# Patient Record
Sex: Female | Born: 1970 | Race: Black or African American | Hispanic: No | Smoking: Never smoker
Health system: Southern US, Community
[De-identification: ages and names within clinical notes are randomized; demographics above are authoritative.]

## PROBLEM LIST (undated history)

## (undated) DIAGNOSIS — I10 Essential (primary) hypertension: Secondary | ICD-10-CM

## (undated) DIAGNOSIS — A539 Syphilis, unspecified: Secondary | ICD-10-CM

## (undated) DIAGNOSIS — F419 Anxiety disorder, unspecified: Secondary | ICD-10-CM

## (undated) DIAGNOSIS — A599 Trichomoniasis, unspecified: Secondary | ICD-10-CM

## (undated) DIAGNOSIS — E0590 Thyrotoxicosis, unspecified without thyrotoxic crisis or storm: Secondary | ICD-10-CM

## (undated) DIAGNOSIS — A749 Chlamydial infection, unspecified: Secondary | ICD-10-CM

## (undated) DIAGNOSIS — F1010 Alcohol abuse, uncomplicated: Secondary | ICD-10-CM

## (undated) HISTORY — PX: OTHER SURGICAL HISTORY: SHX169

## (undated) HISTORY — PX: TUBAL LIGATION: SHX77

## (undated) HISTORY — PX: NOVASURE ABLATION: SHX5394

## (undated) HISTORY — PX: INDUCED ABORTION: SHX677

---

## 1998-07-06 ENCOUNTER — Emergency Department (HOSPITAL_COMMUNITY): Admission: EM | Admit: 1998-07-06 | Discharge: 1998-07-06 | Payer: Self-pay | Admitting: Emergency Medicine

## 1999-12-04 ENCOUNTER — Emergency Department (HOSPITAL_COMMUNITY): Admission: EM | Admit: 1999-12-04 | Discharge: 1999-12-04 | Payer: Self-pay | Admitting: Emergency Medicine

## 2001-07-11 ENCOUNTER — Inpatient Hospital Stay (HOSPITAL_COMMUNITY): Admission: AD | Admit: 2001-07-11 | Discharge: 2001-07-11 | Payer: Self-pay | Admitting: Obstetrics & Gynecology

## 2001-10-06 ENCOUNTER — Encounter: Payer: Self-pay | Admitting: Obstetrics

## 2001-10-06 ENCOUNTER — Inpatient Hospital Stay (HOSPITAL_COMMUNITY): Admission: AD | Admit: 2001-10-06 | Discharge: 2001-10-06 | Payer: Self-pay | Admitting: Obstetrics

## 2001-11-12 ENCOUNTER — Inpatient Hospital Stay (HOSPITAL_COMMUNITY): Admission: AD | Admit: 2001-11-12 | Discharge: 2001-11-12 | Payer: Self-pay | Admitting: Obstetrics

## 2002-02-22 ENCOUNTER — Inpatient Hospital Stay (HOSPITAL_COMMUNITY): Admission: AD | Admit: 2002-02-22 | Discharge: 2002-02-24 | Payer: Self-pay | Admitting: Obstetrics

## 2002-02-22 ENCOUNTER — Encounter (INDEPENDENT_AMBULATORY_CARE_PROVIDER_SITE_OTHER): Payer: Self-pay | Admitting: Specialist

## 2006-12-23 ENCOUNTER — Ambulatory Visit (HOSPITAL_COMMUNITY): Admission: RE | Admit: 2006-12-23 | Discharge: 2006-12-23 | Payer: Self-pay | Admitting: Obstetrics

## 2009-05-18 ENCOUNTER — Encounter (INDEPENDENT_AMBULATORY_CARE_PROVIDER_SITE_OTHER): Payer: Self-pay | Admitting: Obstetrics

## 2009-05-18 ENCOUNTER — Ambulatory Visit (HOSPITAL_COMMUNITY): Admission: RE | Admit: 2009-05-18 | Discharge: 2009-05-18 | Payer: Self-pay | Admitting: Obstetrics

## 2010-05-03 ENCOUNTER — Emergency Department (HOSPITAL_COMMUNITY): Admission: EM | Admit: 2010-05-03 | Discharge: 2010-05-03 | Payer: Self-pay | Admitting: Emergency Medicine

## 2010-07-17 ENCOUNTER — Inpatient Hospital Stay (HOSPITAL_COMMUNITY): Admission: EM | Admit: 2010-07-17 | Discharge: 2010-07-20 | Payer: Self-pay | Admitting: Emergency Medicine

## 2010-07-17 ENCOUNTER — Encounter (INDEPENDENT_AMBULATORY_CARE_PROVIDER_SITE_OTHER): Payer: Self-pay | Admitting: Internal Medicine

## 2011-03-02 LAB — BASIC METABOLIC PANEL
CO2: 24 mEq/L (ref 19–32)
Calcium: 9.6 mg/dL (ref 8.4–10.5)
Sodium: 139 mEq/L (ref 135–145)

## 2011-03-02 LAB — COMPREHENSIVE METABOLIC PANEL
Albumin: 3 g/dL — ABNORMAL LOW (ref 3.5–5.2)
Alkaline Phosphatase: 48 U/L (ref 39–117)
BUN: 3 mg/dL — ABNORMAL LOW (ref 6–23)
Chloride: 109 mEq/L (ref 96–112)
Glucose, Bld: 101 mg/dL — ABNORMAL HIGH (ref 70–99)
Potassium: 3.2 mEq/L — ABNORMAL LOW (ref 3.5–5.1)
Total Bilirubin: 1.5 mg/dL — ABNORMAL HIGH (ref 0.3–1.2)

## 2011-03-02 LAB — DIFFERENTIAL
Basophils Absolute: 0 10*3/uL (ref 0.0–0.1)
Basophils Relative: 0 % (ref 0–1)
Lymphocytes Relative: 48 % — ABNORMAL HIGH (ref 12–46)
Lymphs Abs: 1.8 10*3/uL (ref 0.7–4.0)
Monocytes Absolute: 0.5 10*3/uL (ref 0.1–1.0)
Monocytes Relative: 13 % — ABNORMAL HIGH (ref 3–12)
Neutro Abs: 1.1 10*3/uL — ABNORMAL LOW (ref 1.7–7.7)
Neutro Abs: 1.8 10*3/uL (ref 1.7–7.7)
Neutrophils Relative %: 31 % — ABNORMAL LOW (ref 43–77)
Neutrophils Relative %: 44 % (ref 43–77)

## 2011-03-02 LAB — CBC
HCT: 30.9 % — ABNORMAL LOW (ref 36.0–46.0)
Hemoglobin: 10.4 g/dL — ABNORMAL LOW (ref 12.0–15.0)
Hemoglobin: 10.4 g/dL — ABNORMAL LOW (ref 12.0–15.0)
MCH: 27.6 pg (ref 26.0–34.0)
MCV: 82.2 fL (ref 78.0–100.0)
RBC: 3.76 MIL/uL — ABNORMAL LOW (ref 3.87–5.11)
RBC: 3.77 MIL/uL — ABNORMAL LOW (ref 3.87–5.11)
WBC: 3.7 10*3/uL — ABNORMAL LOW (ref 4.0–10.5)
WBC: 4.2 10*3/uL (ref 4.0–10.5)

## 2011-03-02 LAB — CARDIAC PANEL(CRET KIN+CKTOT+MB+TROPI)
CK, MB: 1.2 ng/mL (ref 0.3–4.0)
Relative Index: INVALID (ref 0.0–2.5)
Relative Index: INVALID (ref 0.0–2.5)

## 2011-03-02 LAB — T3: T3, Total: 571.8 ng/dl — ABNORMAL HIGH (ref 80.0–204.0)

## 2011-03-02 LAB — T4, FREE: Free T4: 4.83 ng/dL — ABNORMAL HIGH (ref 0.80–1.80)

## 2011-03-03 LAB — URINE MICROSCOPIC-ADD ON

## 2011-03-03 LAB — URINALYSIS, ROUTINE W REFLEX MICROSCOPIC
Bilirubin Urine: NEGATIVE
Nitrite: NEGATIVE
Specific Gravity, Urine: 1.028 (ref 1.005–1.030)
pH: 5.5 (ref 5.0–8.0)

## 2011-03-03 LAB — WET PREP, GENITAL: Yeast Wet Prep HPF POC: NONE SEEN

## 2011-03-03 LAB — POCT I-STAT, CHEM 8
BUN: 9 mg/dL (ref 6–23)
Calcium, Ion: 1.23 mmol/L (ref 1.12–1.32)
Chloride: 109 mEq/L (ref 96–112)
Glucose, Bld: 113 mg/dL — ABNORMAL HIGH (ref 70–99)
TCO2: 22 mmol/L (ref 0–100)

## 2011-03-03 LAB — CBC
HCT: 32.7 % — ABNORMAL LOW (ref 36.0–46.0)
Hemoglobin: 11.1 g/dL — ABNORMAL LOW (ref 12.0–15.0)
MCV: 85.3 fL (ref 78.0–100.0)
RBC: 3.83 MIL/uL — ABNORMAL LOW (ref 3.87–5.11)
WBC: 6.7 10*3/uL (ref 4.0–10.5)

## 2011-03-03 LAB — DIFFERENTIAL
Eosinophils Relative: 2 % (ref 0–5)
Lymphocytes Relative: 16 % (ref 12–46)
Lymphs Abs: 1.1 10*3/uL (ref 0.7–4.0)
Monocytes Absolute: 0.6 10*3/uL (ref 0.1–1.0)
Neutro Abs: 4.8 10*3/uL (ref 1.7–7.7)

## 2011-03-03 LAB — GC/CHLAMYDIA PROBE AMP, GENITAL
Chlamydia, DNA Probe: NEGATIVE
GC Probe Amp, Genital: NEGATIVE

## 2011-03-03 LAB — POCT PREGNANCY, URINE: Preg Test, Ur: NEGATIVE

## 2011-03-26 LAB — CBC
Hemoglobin: 13.6 g/dL (ref 12.0–15.0)
MCHC: 33.6 g/dL (ref 30.0–36.0)
RBC: 4.63 MIL/uL (ref 3.87–5.11)
WBC: 4.9 10*3/uL (ref 4.0–10.5)

## 2011-03-26 LAB — PREGNANCY, URINE: Preg Test, Ur: NEGATIVE

## 2011-03-28 ENCOUNTER — Other Ambulatory Visit (HOSPITAL_COMMUNITY): Payer: Self-pay | Admitting: Obstetrics

## 2011-03-28 DIAGNOSIS — Z1231 Encounter for screening mammogram for malignant neoplasm of breast: Secondary | ICD-10-CM

## 2011-04-10 ENCOUNTER — Ambulatory Visit (HOSPITAL_COMMUNITY)
Admission: RE | Admit: 2011-04-10 | Discharge: 2011-04-10 | Disposition: A | Discharge status: Home / Self Care | Payer: Medicaid Other | Source: Ambulatory Visit | Attending: Obstetrics | Admitting: Obstetrics

## 2011-04-10 DIAGNOSIS — Z1231 Encounter for screening mammogram for malignant neoplasm of breast: Secondary | ICD-10-CM | POA: Insufficient documentation

## 2011-04-12 ENCOUNTER — Ambulatory Visit (HOSPITAL_COMMUNITY): Payer: Medicaid Other

## 2011-05-01 NOTE — Op Note (Signed)
NAME:  Kendra Zimmerman, Kendra Zimmerman               ACCOUNT NO.:  [id number]   MEDICAL RECORD NO.:  [id number]          PATIENT TYPE:  AMB   LOCATION:  SDC                           FACILITY:  [facility]   PHYSICIAN:  Kathreen Cosier, M.D.DATE OF BIRTH:  01/08/1971   DATE OF PROCEDURE:  DATE OF DISCHARGE:                               OPERATIVE REPORT   PREOPERATIVE DIAGNOSIS:  Dysfunctional uterine bleeding.   PROCEDURE:  Under spinal anesthesia, the patient in lithotomy position,  abdomen, perineum, and vagina prepped and draped.  Bimanual exam  revealed the uterus to be normal sized, negative adnexa.  Speculum  placed in the vagina.  Cervix injected with 10 mL of 1% Xylocaine.  Anterior lip of the cervix grasped with tenaculum.  Endocervix curetted.  Small amount of tissue obtained.  Cavity sounded 10 cm.  Cervix measured  5 cm.  Cavity length 5 cm.  Cervix dilated to #25 Shawnie Pons and then the  hysteroscope inserted.  The cavity normal.  The hysteroscope removed and  sharp curettage performed.  Large amount of tissue obtained.  A NovaSure  device was then inserted in the cavity.  Integrity test passed.  Cavity  width was 4 cm.  The NovaSure ablation was done at 110 watts for 1  minute and 10 seconds.  The fluid deficit was 75 mL.  A repeat  hysteroscopy revealed total ablation of the endometrial cavity.  The  patient tolerated the procedure well and taken to recovery room in good  condition.           ______________________________  Kathreen Cosier, M.D.     BAM/MEDQ  D:  05/18/2009  T:  05/19/2009  Job:  [id number]

## 2011-05-04 NOTE — Op Note (Signed)
[facility]  Patient:    Kendra Zimmerman, Kendra Zimmerman Visit Number: [id number] MRN: [id number]          Service Type: OBS Location: 910A 9110 01 Attending Physician:  Venita Sheffield Dictated by:   Kathreen Cosier, M.D. Proc. Date: 02/23/02 Admit Date:  02/22/2002                             Operative Report  PREOPERATIVE DIAGNOSES:       Multiparity.  PROCEDURE:                    Postpartum tubal ligation.  Using epidural with patient in the supine position abdomen prepped and draped, bladder emptied with straight catheter.  Midline subumbilical incision 1 inch long was made, carried down to the fascia.  Fascia grasped with two Kochers.  Fascia and peritoneum opened with Mayo scissors.  Left tube grasped in the mid portion with Babcock clamp and tube traced to the fimbria.  Suture 0 plain placed in the mesosalpinx below the portion of tube within the clamp.  This was tied and approximately 1 inch of tube transected.  Procedure done in a similar fashion on other side.  Abdomen closed in layers.  Peritoneum and fascia continuous suture of 0 Dexon.  Skin closed with subcuticular stitch of 3-0 plain. Patient tolerated the procedure well.  Taken to recovery room in good condition. Dictated by:   Kathreen Cosier, M.D. Attending Physician:  Venita Sheffield DD:  02/23/02 TD:  02/24/02 Job: [id number]

## 2011-05-04 NOTE — Discharge Summary (Signed)
[facility]  Patient:    Kendra Zimmerman, Kendra Zimmerman Visit Number: [id number] MRN: [id number]          Service Type: OBS Location: 910A 9110 01 Attending Physician:  Venita Sheffield Dictated by:   Kathreen Cosier, M.D. Admit Date:  02/22/2002 Discharge Date: 02/24/2002                             Discharge Summary  HOSPITAL COURSE:              The patient is a 40 year old gravida 52, para 5-0-4-5, [facility] February 24, 2002 admitted in labor and had a normal vaginal delivery on February 22, 2002.  The patient desired sterilization and on February 23, 2002 underwent postpartum tubal ligation.  Her hemoglobin was 10.2.  She was discharged home on the second postpartum day, ambulatory, on a regular diet and Tylox for pain and ferrous sulfate.  DISCHARGE DIAGNOSIS:          Status post normal vaginal delivery and                               postpartum tubal. Dictated by:   Kathreen Cosier, M.D. Attending Physician:  Venita Sheffield DD:  02/24/02 TD:  02/25/02 Job: [id number]

## 2011-05-25 ENCOUNTER — Other Ambulatory Visit (HOSPITAL_COMMUNITY): Payer: Self-pay | Admitting: Obstetrics

## 2011-05-25 DIAGNOSIS — R1030 Lower abdominal pain, unspecified: Secondary | ICD-10-CM

## 2011-05-30 ENCOUNTER — Other Ambulatory Visit (HOSPITAL_COMMUNITY): Payer: Self-pay | Admitting: Obstetrics

## 2011-05-30 ENCOUNTER — Ambulatory Visit (HOSPITAL_COMMUNITY): Payer: Medicaid Other

## 2011-05-30 ENCOUNTER — Ambulatory Visit (HOSPITAL_COMMUNITY)
Admission: RE | Admit: 2011-05-30 | Discharge: 2011-05-30 | Disposition: A | Discharge status: Home / Self Care | Payer: Medicaid Other | Source: Ambulatory Visit | Attending: Obstetrics | Admitting: Obstetrics

## 2011-05-30 ENCOUNTER — Other Ambulatory Visit (HOSPITAL_COMMUNITY): Payer: Medicaid Other

## 2011-05-30 DIAGNOSIS — D250 Submucous leiomyoma of uterus: Secondary | ICD-10-CM | POA: Insufficient documentation

## 2011-05-30 DIAGNOSIS — R1030 Lower abdominal pain, unspecified: Secondary | ICD-10-CM

## 2011-05-30 DIAGNOSIS — D252 Subserosal leiomyoma of uterus: Secondary | ICD-10-CM | POA: Insufficient documentation

## 2011-05-30 DIAGNOSIS — R1013 Epigastric pain: Secondary | ICD-10-CM | POA: Insufficient documentation

## 2011-05-30 DIAGNOSIS — D251 Intramural leiomyoma of uterus: Secondary | ICD-10-CM | POA: Insufficient documentation

## 2011-05-30 DIAGNOSIS — N949 Unspecified condition associated with female genital organs and menstrual cycle: Secondary | ICD-10-CM | POA: Insufficient documentation

## 2011-05-30 DIAGNOSIS — K8020 Calculus of gallbladder without cholecystitis without obstruction: Secondary | ICD-10-CM | POA: Insufficient documentation

## 2011-08-03 ENCOUNTER — Inpatient Hospital Stay (HOSPITAL_COMMUNITY)
Admission: AD | Admit: 2011-08-03 | Discharge: 2011-08-03 | Disposition: A | Discharge status: Home / Self Care | Payer: Medicaid Other | Source: Ambulatory Visit | Attending: Obstetrics | Admitting: Obstetrics

## 2011-08-03 ENCOUNTER — Encounter (HOSPITAL_COMMUNITY): Payer: Self-pay | Admitting: *Deleted

## 2011-08-03 DIAGNOSIS — N949 Unspecified condition associated with female genital organs and menstrual cycle: Secondary | ICD-10-CM | POA: Insufficient documentation

## 2011-08-03 DIAGNOSIS — N760 Acute vaginitis: Secondary | ICD-10-CM | POA: Insufficient documentation

## 2011-08-03 DIAGNOSIS — A499 Bacterial infection, unspecified: Secondary | ICD-10-CM | POA: Insufficient documentation

## 2011-08-03 DIAGNOSIS — B9689 Other specified bacterial agents as the cause of diseases classified elsewhere: Secondary | ICD-10-CM | POA: Insufficient documentation

## 2011-08-03 HISTORY — DX: Anxiety disorder, unspecified: F41.9

## 2011-08-03 HISTORY — DX: Alcohol abuse, uncomplicated: F10.10

## 2011-08-03 HISTORY — DX: Chlamydial infection, unspecified: A74.9

## 2011-08-03 HISTORY — DX: Thyrotoxicosis, unspecified without thyrotoxic crisis or storm: E05.90

## 2011-08-03 HISTORY — DX: Trichomoniasis, unspecified: A59.9

## 2011-08-03 HISTORY — DX: Syphilis, unspecified: A53.9

## 2011-08-03 LAB — WET PREP, GENITAL

## 2011-08-03 LAB — URINALYSIS, ROUTINE W REFLEX MICROSCOPIC
Glucose, UA: NEGATIVE mg/dL
Ketones, ur: NEGATIVE mg/dL
Leukocytes, UA: NEGATIVE
pH: 6 (ref 5.0–8.0)

## 2011-08-03 MED ORDER — METRONIDAZOLE 500 MG PO TABS: 500.0000 mg | ORAL_TABLET | Freq: Two times a day (BID) | ORAL | Status: AC

## 2011-08-03 NOTE — ED Provider Notes (Addendum)
History     No chief complaint on file.  HPI Pt is not pregnant and comes in complaining of vaginal discharge since 8/13 with an odor.  She used OTC yeast suppx 3d on 7/20- pt has hx of trichomonas- no itching or irritation but bad odor.   She has a history of ablation for heavy periods and fibroids.She has RCM every 3 to 4 weeks- light but worsec ramping  She is also has hyperthyroidism being treated  .  She was tired and weak over the past 2 days with nausea.  She denies diarrhea or constipation or UTI symptoms.  She is concerned about STDs also.  Past Medical History  Diagnosis Date  . Hyperthyroidism   . Chlamydia   . Syphilis   . Trichomonas   . Alcohol abuse     sober 20 months  . Anxiety     Past Surgical History  Procedure Date  . Novasure ablation     2010  . Tubal ligation     No family history on file.  History  Substance Use Topics  . Smoking status: Never Smoker   . Smokeless tobacco: Not on file  . Alcohol Use: No    Allergies: No Known Allergies  Prescriptions prior to admission  Medication Sig Dispense Refill  . methimazole (TAPAZOLE) 10 MG tablet Take 10 mg by mouth daily.        . Multiple Vitamins-Calcium (ONE-A-DAY WOMENS FORMULA PO) Take 1 tablet by mouth daily.          ROS Physical Exam   Blood pressure 145/102, pulse 75, temperature 97.8 F (36.6 C), temperature source Oral, resp. rate 20, height 5\' 2"  (1.575 m), weight 165 lb (74.844 kg), last menstrual period 07/22/2011, SpO2 99.00%.  Physical Exam  Nursing note and vitals reviewed. Constitutional: She is oriented to person, place, and time. She appears well-developed and well-nourished.  HENT:  Head: Normocephalic.  Eyes: Pupils are equal, round, and reactive to light.  Neck: Normal range of motion. Neck supple.  Respiratory: Effort normal.  GI: Soft.  Genitourinary: Vagina normal and uterus normal. No vaginal discharge found.       Small amount of white discharge in vault; no  odor detected; cervix parous nontender; adnexal without palpable enlargement or tenderness  Musculoskeletal: Normal range of motion.  Neurological: She is alert and oriented to person, place, and time.  Skin: Skin is warm and dry.  Psychiatric: She has a normal mood and affect.    MAU Course  Procedures Pelvic exam performed with wet prep and GC/chlamydia performed MDM   Assessment and Plan   Bacterial vaginosis Will treat with Flagyl for 7 days Duran Ohern 08/03/2011, 9:36 PM

## 2011-08-03 NOTE — Progress Notes (Signed)
Pt c/o lower abdominal cramping and vaginal discharge x5 days. N/V x2 days.

## 2011-08-04 LAB — GC/CHLAMYDIA PROBE AMP, GENITAL
Chlamydia, DNA Probe: NEGATIVE
GC Probe Amp, Genital: NEGATIVE

## 2011-08-18 ENCOUNTER — Emergency Department (HOSPITAL_COMMUNITY)
Admission: EM | Admit: 2011-08-18 | Discharge: 2011-08-18 | Disposition: A | Discharge status: Home / Self Care | Payer: Medicaid Other | Attending: Emergency Medicine | Admitting: Emergency Medicine

## 2011-08-18 DIAGNOSIS — E0590 Thyrotoxicosis, unspecified without thyrotoxic crisis or storm: Secondary | ICD-10-CM | POA: Insufficient documentation

## 2011-08-18 DIAGNOSIS — H11429 Conjunctival edema, unspecified eye: Secondary | ICD-10-CM | POA: Insufficient documentation

## 2011-08-18 DIAGNOSIS — H5789 Other specified disorders of eye and adnexa: Secondary | ICD-10-CM | POA: Insufficient documentation

## 2011-08-18 DIAGNOSIS — H04209 Unspecified epiphora, unspecified lacrimal gland: Secondary | ICD-10-CM | POA: Insufficient documentation

## 2011-09-15 ENCOUNTER — Emergency Department (HOSPITAL_COMMUNITY)
Admission: EM | Admit: 2011-09-15 | Discharge: 2011-09-15 | Disposition: A | Discharge status: Home / Self Care | Payer: Medicaid Other | Attending: Emergency Medicine | Admitting: Emergency Medicine

## 2011-09-15 DIAGNOSIS — R109 Unspecified abdominal pain: Secondary | ICD-10-CM | POA: Insufficient documentation

## 2011-09-15 LAB — DIFFERENTIAL
Basophils Absolute: 0 10*3/uL (ref 0.0–0.1)
Basophils Relative: 0 % (ref 0–1)
Lymphocytes Relative: 14 % (ref 12–46)
Monocytes Absolute: 0.3 10*3/uL (ref 0.1–1.0)
Neutro Abs: 6.1 10*3/uL (ref 1.7–7.7)
Neutrophils Relative %: 80 % — ABNORMAL HIGH (ref 43–77)

## 2011-09-15 LAB — COMPREHENSIVE METABOLIC PANEL
ALT: 6 U/L (ref 0–35)
Alkaline Phosphatase: 73 U/L (ref 39–117)
BUN: 8 mg/dL (ref 6–23)
CO2: 25 mEq/L (ref 19–32)
Chloride: 102 mEq/L (ref 96–112)
Glucose, Bld: 107 mg/dL — ABNORMAL HIGH (ref 70–99)
Potassium: 3.7 mEq/L (ref 3.5–5.1)
Sodium: 134 mEq/L — ABNORMAL LOW (ref 135–145)
Total Bilirubin: 1.4 mg/dL — ABNORMAL HIGH (ref 0.3–1.2)
Total Protein: 7.7 g/dL (ref 6.0–8.3)

## 2011-09-15 LAB — CBC
HCT: 38.3 % (ref 36.0–46.0)
Hemoglobin: 13.8 g/dL (ref 12.0–15.0)
RBC: 4.58 MIL/uL (ref 3.87–5.11)
WBC: 7.6 10*3/uL (ref 4.0–10.5)

## 2011-09-15 LAB — URINALYSIS, ROUTINE W REFLEX MICROSCOPIC
Bilirubin Urine: NEGATIVE
Ketones, ur: NEGATIVE mg/dL
Nitrite: NEGATIVE
Protein, ur: NEGATIVE mg/dL
pH: 6 (ref 5.0–8.0)

## 2011-09-16 LAB — URINE CULTURE: Culture  Setup Time: 201209291750

## 2011-12-16 ENCOUNTER — Encounter (HOSPITAL_COMMUNITY): Payer: Self-pay | Admitting: *Deleted

## 2011-12-16 ENCOUNTER — Inpatient Hospital Stay (HOSPITAL_COMMUNITY)
Admission: AD | Admit: 2011-12-16 | Discharge: 2011-12-16 | Disposition: A | Discharge status: Home / Self Care | Payer: Medicaid Other | Source: Ambulatory Visit | Attending: Obstetrics | Admitting: Obstetrics

## 2011-12-16 DIAGNOSIS — N760 Acute vaginitis: Secondary | ICD-10-CM | POA: Insufficient documentation

## 2011-12-16 DIAGNOSIS — B9689 Other specified bacterial agents as the cause of diseases classified elsewhere: Secondary | ICD-10-CM | POA: Insufficient documentation

## 2011-12-16 DIAGNOSIS — A499 Bacterial infection, unspecified: Secondary | ICD-10-CM | POA: Insufficient documentation

## 2011-12-16 DIAGNOSIS — B373 Candidiasis of vulva and vagina: Secondary | ICD-10-CM

## 2011-12-16 DIAGNOSIS — B3731 Acute candidiasis of vulva and vagina: Secondary | ICD-10-CM | POA: Insufficient documentation

## 2011-12-16 DIAGNOSIS — L293 Anogenital pruritus, unspecified: Secondary | ICD-10-CM | POA: Insufficient documentation

## 2011-12-16 HISTORY — DX: Essential (primary) hypertension: I10

## 2011-12-16 LAB — URINALYSIS, ROUTINE W REFLEX MICROSCOPIC
Ketones, ur: NEGATIVE mg/dL
Leukocytes, UA: NEGATIVE
Nitrite: NEGATIVE
Protein, ur: NEGATIVE mg/dL
pH: 5.5 (ref 5.0–8.0)

## 2011-12-16 LAB — WET PREP, GENITAL

## 2011-12-16 LAB — POCT PREGNANCY, URINE: Preg Test, Ur: NEGATIVE

## 2011-12-16 MED ORDER — FLUCONAZOLE 150 MG PO TABS
150.0000 mg | ORAL_TABLET | Freq: Once | ORAL | Status: AC
  Administered 2011-12-16: 150 mg via ORAL
  Filled 2011-12-16: qty 1

## 2011-12-16 MED ORDER — METRONIDAZOLE 500 MG PO TABS: 500.0000 mg | ORAL_TABLET | Freq: Two times a day (BID) | ORAL | Status: AC

## 2011-12-16 NOTE — ED Provider Notes (Signed)
History     Chief Complaint  Patient presents with  . Vaginal Discharge  . Vaginal Itching   HPI  Pt is here with report of vaginal and rectal itching x 1 month.  No reports of lesions. +yellow discharge in underwear.  No sexual intercourse since October 2012.    Past Medical History  Diagnosis Date  . Hyperthyroidism   . Chlamydia   . Syphilis   . Trichomonas   . Alcohol abuse     sober 20 months  . Anxiety   . Hypertension     Past Surgical History  Procedure Date  . Novasure ablation     2010  . Tubal ligation   . Induced abortion     Family History  Problem Relation Age of Onset  . Anesthesia problems Neg Hx   . Hypotension Neg Hx   . Malignant hyperthermia Neg Hx   . Pseudochol deficiency Neg Hx     History  Substance Use Topics  . Smoking status: Never Smoker   . Smokeless tobacco: Not on file  . Alcohol Use: No     hx alcohol use but has stopped    Allergies: No Known Allergies  Prescriptions prior to admission  Medication Sig Dispense Refill  . methimazole (TAPAZOLE) 10 MG tablet Take 15 mg by mouth daily.       Marland Kitchen olmesartan (BENICAR) 20 MG tablet Take 20 mg by mouth daily.        Marland Kitchen DISCONTD: Multiple Vitamins-Calcium (ONE-A-DAY WOMENS FORMULA PO) Take 1 tablet by mouth daily.         Review of Systems  Gastrointestinal: Positive for abdominal pain (mild cramping).  Genitourinary:       +vaginal discharge  All other systems reviewed and are negative.   Physical Exam   Blood pressure 124/79, pulse 80, temperature 98.1 F (36.7 C), temperature source Oral, resp. rate 22, height 5\' 3"  (1.6 m), weight 77.111 kg (170 lb), last menstrual period 11/29/2011, SpO2 98.00%.  Physical Exam  Constitutional: She is oriented to person, place, and time. She appears well-developed and well-nourished. No distress.  HENT:  Head: Normocephalic.  Neck: Normal range of motion. Neck supple.  Cardiovascular: Normal rate and regular rhythm.   Respiratory:  Effort normal and breath sounds normal.  GI: Soft. She exhibits no mass. There is no tenderness. There is no guarding.  Genitourinary: Cervix exhibits no motion tenderness. No bleeding around the vagina. Vaginal discharge (white, creamy) found.  Neurological: She is alert and oriented to person, place, and time. She has normal reflexes.  Skin: Skin is warm and dry.    MAU Course  Procedures  Results for orders placed during the hospital encounter of 12/16/11 (from the past 24 hour(s))  URINALYSIS, ROUTINE W REFLEX MICROSCOPIC     Status: Normal   Collection Time   12/16/11  4:00 AM      Component Value Range   Color, Urine YELLOW  YELLOW    APPearance CLEAR  CLEAR    Specific Gravity, Urine 1.025  1.005 - 1.030    pH 5.5  5.0 - 8.0    Glucose, UA NEGATIVE  NEGATIVE (mg/dL)   Hgb urine dipstick NEGATIVE  NEGATIVE    Bilirubin Urine NEGATIVE  NEGATIVE    Ketones, ur NEGATIVE  NEGATIVE (mg/dL)   Protein, ur NEGATIVE  NEGATIVE (mg/dL)   Urobilinogen, UA 0.2  0.0 - 1.0 (mg/dL)   Nitrite NEGATIVE  NEGATIVE    Leukocytes, UA NEGATIVE  NEGATIVE   POCT PREGNANCY, URINE     Status: Normal   Collection Time   12/16/11  4:17 AM      Component Value Range   Preg Test, Ur NEGATIVE    WET PREP, GENITAL     Status: Abnormal   Collection Time   12/16/11  4:46 AM      Component Value Range   Yeast, Wet Prep RARE (*) NONE SEEN    Trich, Wet Prep NONE SEEN  NONE SEEN    Clue Cells, Wet Prep RARE (*) NONE SEEN    WBC, Wet Prep HPF POC MODERATE (*) NONE SEEN    Gc/ct pending  Assessment and Plan  Yeast Infection Bacterial Vaginosis  RX Diflucan Flagyl 500 mg BID x 7 days F/U w/Dr. Gaynell Face  [facility] 12/16/2011, 4:38 AM

## 2011-12-16 NOTE — Progress Notes (Signed)
Threasa Heads CNM in to see pt. Spec exam done and wet prep and GC/Chlam obtained. Pt tol well.

## 2011-12-16 NOTE — Progress Notes (Signed)
W. Muhammed CNM in earlier to discuss lab results and d/c plan.Written and verbal d/c instructions given and understanding voiced. 

## 2011-12-16 NOTE — Progress Notes (Signed)
Noticed some vag. And rectal irritation since early December. Has gradually gotten worse. Stopped BCP mid-Dec due to acne. Had uterine lining burned 05/19/2011.

## 2011-12-17 LAB — GC/CHLAMYDIA PROBE AMP, GENITAL: Chlamydia, DNA Probe: NEGATIVE

## 2013-06-22 ENCOUNTER — Other Ambulatory Visit: Payer: Self-pay | Admitting: Internal Medicine

## 2013-06-22 DIAGNOSIS — E0590 Thyrotoxicosis, unspecified without thyrotoxic crisis or storm: Secondary | ICD-10-CM

## 2013-06-29 DIAGNOSIS — E0590 Thyrotoxicosis, unspecified without thyrotoxic crisis or storm: Secondary | ICD-10-CM | POA: Insufficient documentation

## 2013-06-30 ENCOUNTER — Encounter (HOSPITAL_COMMUNITY)
Admission: RE | Admit: 2013-06-30 | Discharge: 2013-06-30 | Disposition: A | Discharge status: Home / Self Care | Payer: Medicaid Other | Source: Ambulatory Visit | Attending: Internal Medicine | Admitting: Internal Medicine

## 2013-06-30 DIAGNOSIS — E0590 Thyrotoxicosis, unspecified without thyrotoxic crisis or storm: Secondary | ICD-10-CM

## 2013-06-30 MED ORDER — SODIUM PERTECHNETATE TC 99M INJECTION
10.5000 | Freq: Once | INTRAVENOUS | Status: AC | PRN
  Administered 2013-06-30: 11 via INTRAVENOUS

## 2013-06-30 MED ORDER — SODIUM IODIDE I 131 CAPSULE
10.0000 | Freq: Once | INTRAVENOUS | Status: AC | PRN
  Administered 2013-06-29: 10 via ORAL

## 2013-07-02 ENCOUNTER — Other Ambulatory Visit (HOSPITAL_COMMUNITY): Payer: Self-pay | Admitting: Obstetrics

## 2013-07-02 DIAGNOSIS — Z1231 Encounter for screening mammogram for malignant neoplasm of breast: Secondary | ICD-10-CM

## 2013-07-06 ENCOUNTER — Other Ambulatory Visit: Payer: Self-pay | Admitting: Internal Medicine

## 2013-07-06 DIAGNOSIS — E0590 Thyrotoxicosis, unspecified without thyrotoxic crisis or storm: Secondary | ICD-10-CM

## 2013-07-06 DIAGNOSIS — E0500 Thyrotoxicosis with diffuse goiter without thyrotoxic crisis or storm: Secondary | ICD-10-CM

## 2013-07-09 ENCOUNTER — Ambulatory Visit
Admission: RE | Admit: 2013-07-09 | Discharge: 2013-07-09 | Disposition: A | Discharge status: Home / Self Care | Payer: Medicaid Other | Source: Ambulatory Visit | Attending: Internal Medicine | Admitting: Internal Medicine

## 2013-07-09 DIAGNOSIS — E0500 Thyrotoxicosis with diffuse goiter without thyrotoxic crisis or storm: Secondary | ICD-10-CM

## 2013-07-09 DIAGNOSIS — E0590 Thyrotoxicosis, unspecified without thyrotoxic crisis or storm: Secondary | ICD-10-CM

## 2013-07-14 ENCOUNTER — Ambulatory Visit (HOSPITAL_COMMUNITY)
Admission: RE | Admit: 2013-07-14 | Discharge: 2013-07-14 | Disposition: A | Discharge status: Home / Self Care | Payer: Medicaid Other | Source: Ambulatory Visit | Attending: Obstetrics | Admitting: Obstetrics

## 2013-07-14 DIAGNOSIS — Z1231 Encounter for screening mammogram for malignant neoplasm of breast: Secondary | ICD-10-CM | POA: Insufficient documentation

## 2013-07-16 ENCOUNTER — Encounter (HOSPITAL_COMMUNITY)
Admission: RE | Admit: 2013-07-16 | Discharge: 2013-07-16 | Disposition: A | Discharge status: Home / Self Care | Payer: Medicaid Other | Source: Ambulatory Visit | Attending: Internal Medicine | Admitting: Internal Medicine

## 2013-07-16 DIAGNOSIS — E0590 Thyrotoxicosis, unspecified without thyrotoxic crisis or storm: Secondary | ICD-10-CM | POA: Insufficient documentation

## 2013-07-16 MED ORDER — SODIUM IODIDE I 131 CAPSULE
17.5000 | Freq: Once | INTRAVENOUS | Status: AC | PRN
  Administered 2013-07-16: 17.5 via ORAL

## 2013-09-07 ENCOUNTER — Other Ambulatory Visit: Payer: Self-pay | Admitting: Internal Medicine

## 2013-09-07 DIAGNOSIS — E041 Nontoxic single thyroid nodule: Secondary | ICD-10-CM

## 2013-09-09 ENCOUNTER — Ambulatory Visit
Admission: RE | Admit: 2013-09-09 | Discharge: 2013-09-09 | Disposition: A | Discharge status: Home / Self Care | Payer: Medicaid Other | Source: Ambulatory Visit | Attending: Internal Medicine | Admitting: Internal Medicine

## 2013-09-09 DIAGNOSIS — E041 Nontoxic single thyroid nodule: Secondary | ICD-10-CM

## 2013-09-16 ENCOUNTER — Ambulatory Visit
Admission: RE | Admit: 2013-09-16 | Discharge: 2013-09-16 | Disposition: A | Discharge status: Home / Self Care | Payer: Medicaid Other | Source: Ambulatory Visit | Attending: Internal Medicine | Admitting: Internal Medicine

## 2013-09-16 ENCOUNTER — Other Ambulatory Visit: Payer: Self-pay | Admitting: Internal Medicine

## 2013-09-16 DIAGNOSIS — E041 Nontoxic single thyroid nodule: Secondary | ICD-10-CM

## 2013-12-22 ENCOUNTER — Other Ambulatory Visit: Payer: Self-pay | Admitting: Internal Medicine

## 2013-12-22 DIAGNOSIS — E041 Nontoxic single thyroid nodule: Secondary | ICD-10-CM

## 2013-12-30 ENCOUNTER — Other Ambulatory Visit: Payer: Medicaid Other

## 2014-01-08 ENCOUNTER — Other Ambulatory Visit: Payer: Medicaid Other

## 2014-06-22 ENCOUNTER — Other Ambulatory Visit (HOSPITAL_COMMUNITY): Payer: Self-pay | Admitting: Obstetrics

## 2014-06-22 DIAGNOSIS — Z1231 Encounter for screening mammogram for malignant neoplasm of breast: Secondary | ICD-10-CM

## 2014-07-16 ENCOUNTER — Ambulatory Visit (HOSPITAL_COMMUNITY)
Admission: RE | Admit: 2014-07-16 | Discharge: 2014-07-16 | Disposition: A | Discharge status: Home / Self Care | Payer: Medicaid Other | Source: Ambulatory Visit | Attending: Obstetrics | Admitting: Obstetrics

## 2014-07-16 DIAGNOSIS — Z1231 Encounter for screening mammogram for malignant neoplasm of breast: Secondary | ICD-10-CM | POA: Diagnosis present

## 2014-11-30 ENCOUNTER — Other Ambulatory Visit: Payer: Self-pay | Admitting: Internal Medicine

## 2014-11-30 DIAGNOSIS — E0500 Thyrotoxicosis with diffuse goiter without thyrotoxic crisis or storm: Secondary | ICD-10-CM

## 2014-12-06 ENCOUNTER — Ambulatory Visit
Admission: RE | Admit: 2014-12-06 | Discharge: 2014-12-06 | Disposition: A | Discharge status: Home / Self Care | Payer: Medicaid Other | Source: Ambulatory Visit | Attending: Internal Medicine | Admitting: Internal Medicine

## 2014-12-06 DIAGNOSIS — E0500 Thyrotoxicosis with diffuse goiter without thyrotoxic crisis or storm: Secondary | ICD-10-CM

## 2015-02-23 ENCOUNTER — Other Ambulatory Visit (HOSPITAL_COMMUNITY): Payer: Self-pay | Admitting: Obstetrics

## 2015-02-23 DIAGNOSIS — N858 Other specified noninflammatory disorders of uterus: Secondary | ICD-10-CM

## 2015-03-01 ENCOUNTER — Ambulatory Visit (HOSPITAL_COMMUNITY): Payer: Medicaid Other

## 2015-03-04 ENCOUNTER — Ambulatory Visit (HOSPITAL_COMMUNITY)
Admission: RE | Admit: 2015-03-04 | Discharge: 2015-03-04 | Disposition: A | Discharge status: Home / Self Care | Payer: Medicaid Other | Source: Ambulatory Visit | Attending: Obstetrics | Admitting: Obstetrics

## 2015-03-04 DIAGNOSIS — D252 Subserosal leiomyoma of uterus: Secondary | ICD-10-CM | POA: Insufficient documentation

## 2015-03-04 DIAGNOSIS — N858 Other specified noninflammatory disorders of uterus: Secondary | ICD-10-CM

## 2015-03-04 DIAGNOSIS — D259 Leiomyoma of uterus, unspecified: Secondary | ICD-10-CM | POA: Diagnosis present

## 2015-03-23 ENCOUNTER — Other Ambulatory Visit (HOSPITAL_COMMUNITY): Payer: Self-pay | Admitting: Obstetrics

## 2015-03-23 DIAGNOSIS — D259 Leiomyoma of uterus, unspecified: Secondary | ICD-10-CM

## 2015-03-29 ENCOUNTER — Other Ambulatory Visit (HOSPITAL_COMMUNITY): Payer: Medicaid Other

## 2015-04-01 ENCOUNTER — Ambulatory Visit (HOSPITAL_COMMUNITY): Payer: Medicaid Other

## 2015-04-15 ENCOUNTER — Ambulatory Visit (HOSPITAL_COMMUNITY)
Admission: RE | Admit: 2015-04-15 | Discharge: 2015-04-15 | Disposition: A | Discharge status: Home / Self Care | Payer: Medicaid Other | Source: Ambulatory Visit | Attending: Obstetrics | Admitting: Obstetrics

## 2015-04-15 DIAGNOSIS — D259 Leiomyoma of uterus, unspecified: Secondary | ICD-10-CM | POA: Diagnosis not present

## 2015-10-06 ENCOUNTER — Other Ambulatory Visit: Payer: Self-pay

## 2015-10-06 DIAGNOSIS — Z1231 Encounter for screening mammogram for malignant neoplasm of breast: Secondary | ICD-10-CM

## 2015-10-24 ENCOUNTER — Ambulatory Visit
Admission: RE | Admit: 2015-10-24 | Discharge: 2015-10-24 | Disposition: A | Discharge status: Home / Self Care | Payer: Medicaid Other | Source: Ambulatory Visit

## 2015-10-24 DIAGNOSIS — Z1231 Encounter for screening mammogram for malignant neoplasm of breast: Secondary | ICD-10-CM

## 2016-01-19 ENCOUNTER — Other Ambulatory Visit (HOSPITAL_COMMUNITY): Payer: Self-pay | Admitting: Obstetrics

## 2016-01-19 DIAGNOSIS — D259 Leiomyoma of uterus, unspecified: Secondary | ICD-10-CM

## 2016-01-19 LAB — PROCEDURE REPORT - SCANNED: Pap: NEGATIVE

## 2016-01-26 ENCOUNTER — Ambulatory Visit (HOSPITAL_COMMUNITY)
Admission: RE | Admit: 2016-01-26 | Discharge: 2016-01-26 | Disposition: A | Discharge status: Home / Self Care | Payer: Medicaid Other | Source: Ambulatory Visit | Attending: Obstetrics | Admitting: Obstetrics

## 2016-01-26 DIAGNOSIS — D259 Leiomyoma of uterus, unspecified: Secondary | ICD-10-CM | POA: Insufficient documentation

## 2016-01-31 ENCOUNTER — Other Ambulatory Visit: Payer: Self-pay | Admitting: Obstetrics

## 2016-02-02 NOTE — H&P (Signed)
NAME:  Kendra Zimmerman, Kendra Zimmerman               ACCOUNT NO.:  [id number]  MEDICAL RECORD NO.:  [id number]  LOCATION:  ULT                           FACILITY:  [facility]  PHYSICIAN:  Frederico Hamman, M.D.DATE OF BIRTH:  06-05-1971  DATE OF ADMISSION:  01/26/2016 DATE OF DISCHARGE:  01/26/2016                             HISTORY & PHYSICAL   HISTORY OF PRESENT ILLNESS:  The patient is a 45 year old, gravida 39, para 6-0-4-6 with a history of large myomas, and the patient has been complaining of pain and pressure and she is in for multiple myomectomy.  PAST SURGICAL HISTORY:  She had a tubal ligation, and she also had a NovaSure ablation.  PAST MEDICAL HISTORY:  She has thyroid disease, and she is hypertensive.  MEDICATIONS:  She takes lisinopril daily for her blood pressure, levothyroxine 0.25 everyday for her thyroid.  SOCIAL HISTORY:  She denies smoking, drinking, or drug use.  SYSTEM REVIEW:  Noncontributory.  PHYSICAL EXAMINATION:  GENERAL:  Well-developed female in no distress. HEENT:  Negative. LUNGS:  Clear to P and A. HEART:  Regular rhythm.  No murmurs, no gallops. BREASTS:  Negative. ABDOMEN:  Nontender, mass arising from the pelvis.  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 PELVIC:  Her Pap smear is negative. EXTREMITIES:  Negative.          ______________________________ Frederico Hamman, M.D.     BAM/MEDQ  D:  02/02/2016  T:  02/02/2016  Job:  [id number]

## 2016-02-07 ENCOUNTER — Encounter (HOSPITAL_COMMUNITY)
Admission: RE | Admit: 2016-02-07 | Discharge: 2016-02-07 | Disposition: A | Discharge status: Home / Self Care | Payer: Medicaid Other | Source: Ambulatory Visit | Attending: Obstetrics | Admitting: Obstetrics

## 2016-02-07 ENCOUNTER — Encounter (HOSPITAL_COMMUNITY): Payer: Self-pay

## 2016-02-07 LAB — CBC
HCT: 39.8 % (ref 36.0–46.0)
Hemoglobin: 14 g/dL (ref 12.0–15.0)
MCH: 30.6 pg (ref 26.0–34.0)
MCHC: 35.2 g/dL (ref 30.0–36.0)
MCV: 86.9 fL (ref 78.0–100.0)
Platelets: 315 10*3/uL (ref 150–400)
RBC: 4.58 MIL/uL (ref 3.87–5.11)
RDW: 13 % (ref 11.5–15.5)
WBC: 4.4 10*3/uL (ref 4.0–10.5)

## 2016-02-07 LAB — TYPE AND SCREEN
ABO/RH(D): O POS
ANTIBODY SCREEN: NEGATIVE

## 2016-02-07 LAB — BASIC METABOLIC PANEL
Anion gap: 6 (ref 5–15)
BUN: 13 mg/dL (ref 6–20)
CALCIUM: 9.2 mg/dL (ref 8.9–10.3)
CO2: 26 mmol/L (ref 22–32)
CREATININE: 0.55 mg/dL (ref 0.44–1.00)
Chloride: 106 mmol/L (ref 101–111)
Glucose, Bld: 129 mg/dL — ABNORMAL HIGH (ref 65–99)
Potassium: 3.9 mmol/L (ref 3.5–5.1)
SODIUM: 138 mmol/L (ref 135–145)

## 2016-02-07 LAB — ABO/RH: ABO/RH(D): O POS

## 2016-02-07 NOTE — Patient Instructions (Signed)
Your procedure is scheduled on: February 08, 2016    Enter through the Main Entrance of [facility] at: 7:30 am   Pick up the phone at the desk and dial [phone].  Call this number if you have problems the morning of surgery: [phone].  Remember: Do NOT eat food: after midnight tonight  Do NOT drink clear liquids after: midnight tonight  Take these medicines the morning of surgery with a SIP OF WATER: Synthroid   Do NOT wear jewelry (body piercing), metal hair clips/bobby pins, make-up, or nail polish. Do NOT wear lotions, powders, or perfumes.  You may wear deoderant. Do NOT shave for 48 hours prior to surgery. Do NOT bring valuables to the hospital. Contacts, dentures, or bridgework may not be worn into surgery. Leave suitcase in car.  After surgery it may be brought to your room.  For patients admitted to the hospital, checkout time is 11:00 AM the day of discharge.

## 2016-02-08 ENCOUNTER — Inpatient Hospital Stay (HOSPITAL_COMMUNITY): Payer: Medicaid Other | Admitting: Anesthesiology

## 2016-02-08 ENCOUNTER — Encounter (HOSPITAL_COMMUNITY)
Admission: RE | Disposition: A | Discharge status: Home / Self Care | Payer: Self-pay | Source: Ambulatory Visit | Attending: Obstetrics

## 2016-02-08 ENCOUNTER — Inpatient Hospital Stay (HOSPITAL_COMMUNITY)
Admission: RE | Admit: 2016-02-08 | Discharge: 2016-02-10 | DRG: 743 | Disposition: A | Discharge status: Home / Self Care | Payer: Medicaid Other | Source: Ambulatory Visit | Attending: Obstetrics | Admitting: Obstetrics

## 2016-02-08 ENCOUNTER — Encounter (HOSPITAL_COMMUNITY): Payer: Self-pay | Admitting: Anesthesiology

## 2016-02-08 DIAGNOSIS — E079 Disorder of thyroid, unspecified: Secondary | ICD-10-CM | POA: Diagnosis present

## 2016-02-08 DIAGNOSIS — Z79899 Other long term (current) drug therapy: Secondary | ICD-10-CM

## 2016-02-08 DIAGNOSIS — D259 Leiomyoma of uterus, unspecified: Principal | ICD-10-CM | POA: Diagnosis present

## 2016-02-08 DIAGNOSIS — I10 Essential (primary) hypertension: Secondary | ICD-10-CM | POA: Diagnosis present

## 2016-02-08 DIAGNOSIS — D219 Benign neoplasm of connective and other soft tissue, unspecified: Secondary | ICD-10-CM | POA: Diagnosis present

## 2016-02-08 HISTORY — PX: MYOMECTOMY: SHX85

## 2016-02-08 LAB — PREGNANCY, URINE: PREG TEST UR: NEGATIVE

## 2016-02-08 SURGERY — MYOMECTOMY, ABDOMINAL APPROACH
Anesthesia: General

## 2016-02-08 MED ORDER — LABETALOL HCL 5 MG/ML IV SOLN
INTRAVENOUS | Status: DC | PRN
  Administered 2016-02-08: 5 mg via INTRAVENOUS

## 2016-02-08 MED ORDER — HYDROMORPHONE HCL 1 MG/ML IJ SOLN
INTRAMUSCULAR | Status: DC | PRN
  Administered 2016-02-08: 0.5 mg via INTRAVENOUS
  Administered 2016-02-08: .5 mg via INTRAVENOUS

## 2016-02-08 MED ORDER — KETOROLAC TROMETHAMINE 30 MG/ML IJ SOLN: 30.0000 mg | Freq: Once | INTRAMUSCULAR | Status: DC

## 2016-02-08 MED ORDER — ROCURONIUM BROMIDE 100 MG/10ML IV SOLN
INTRAVENOUS | Status: DC | PRN
  Administered 2016-02-08 (×3): 10 mg via INTRAVENOUS
  Administered 2016-02-08: 30 mg via INTRAVENOUS

## 2016-02-08 MED ORDER — SCOPOLAMINE 1 MG/3DAYS TD PT72
MEDICATED_PATCH | TRANSDERMAL | Status: AC
  Administered 2016-02-08: 1.5 mg via TRANSDERMAL
  Filled 2016-02-08: qty 1

## 2016-02-08 MED ORDER — IBUPROFEN 800 MG PO TABS
800.0000 mg | ORAL_TABLET | Freq: Three times a day (TID) | ORAL | Status: DC | PRN
  Administered 2016-02-09 – 2016-02-10 (×3): 800 mg via ORAL
  Filled 2016-02-08 (×3): qty 1

## 2016-02-08 MED ORDER — DEXAMETHASONE SODIUM PHOSPHATE 10 MG/ML IJ SOLN
INTRAMUSCULAR | Status: AC
  Filled 2016-02-08: qty 1

## 2016-02-08 MED ORDER — PROMETHAZINE HCL 25 MG/ML IJ SOLN: 6.2500 mg | INTRAMUSCULAR | Status: DC | PRN

## 2016-02-08 MED ORDER — GLYCOPYRROLATE 0.2 MG/ML IJ SOLN
INTRAMUSCULAR | Status: DC | PRN
  Administered 2016-02-08: .6 mg via INTRAVENOUS

## 2016-02-08 MED ORDER — ONDANSETRON HCL 4 MG/2ML IJ SOLN
INTRAMUSCULAR | Status: AC
  Filled 2016-02-08: qty 2

## 2016-02-08 MED ORDER — MIDAZOLAM HCL 2 MG/2ML IJ SOLN
INTRAMUSCULAR | Status: DC | PRN
  Administered 2016-02-08: 2 mg via INTRAVENOUS

## 2016-02-08 MED ORDER — KETOROLAC TROMETHAMINE 30 MG/ML IJ SOLN: 30.0000 mg | Freq: Four times a day (QID) | INTRAMUSCULAR | Status: DC

## 2016-02-08 MED ORDER — PROPOFOL 10 MG/ML IV BOLUS
INTRAVENOUS | Status: DC | PRN
  Administered 2016-02-08: 200 mg via INTRAVENOUS

## 2016-02-08 MED ORDER — PROPOFOL 10 MG/ML IV BOLUS
INTRAVENOUS | Status: AC
  Filled 2016-02-08: qty 20

## 2016-02-08 MED ORDER — SCOPOLAMINE 1 MG/3DAYS TD PT72
1.0000 | MEDICATED_PATCH | Freq: Once | TRANSDERMAL | Status: DC
  Administered 2016-02-08: 1.5 mg via TRANSDERMAL

## 2016-02-08 MED ORDER — KETOROLAC TROMETHAMINE 30 MG/ML IJ SOLN
INTRAMUSCULAR | Status: DC | PRN
  Administered 2016-02-08: 30 mg via INTRAVENOUS

## 2016-02-08 MED ORDER — FENTANYL CITRATE (PF) 250 MCG/5ML IJ SOLN
INTRAMUSCULAR | Status: AC
  Filled 2016-02-08: qty 5

## 2016-02-08 MED ORDER — LIDOCAINE HCL (CARDIAC) 20 MG/ML IV SOLN
INTRAVENOUS | Status: AC
  Filled 2016-02-08: qty 5

## 2016-02-08 MED ORDER — LISINOPRIL 20 MG PO TABS
20.0000 mg | ORAL_TABLET | Freq: Every day | ORAL | Status: DC
  Filled 2016-02-08 (×3): qty 1

## 2016-02-08 MED ORDER — ONDANSETRON HCL 4 MG/2ML IJ SOLN
INTRAMUSCULAR | Status: DC | PRN
  Administered 2016-02-08: 4 mg via INTRAVENOUS

## 2016-02-08 MED ORDER — LABETALOL HCL 5 MG/ML IV SOLN
INTRAVENOUS | Status: AC
  Filled 2016-02-08: qty 4

## 2016-02-08 MED ORDER — CEFAZOLIN SODIUM-DEXTROSE 2-3 GM-% IV SOLR
2.0000 g | INTRAVENOUS | Status: AC
  Administered 2016-02-08: 2 g via INTRAVENOUS

## 2016-02-08 MED ORDER — LABETALOL HCL 5 MG/ML IV SOLN
5.0000 mg | INTRAVENOUS | Status: DC | PRN
  Administered 2016-02-08: 5 mg via INTRAVENOUS

## 2016-02-08 MED ORDER — OXYCODONE-ACETAMINOPHEN 5-325 MG PO TABS
1.0000 | ORAL_TABLET | ORAL | Status: DC | PRN
  Administered 2016-02-09 (×2): 2 via ORAL
  Administered 2016-02-10: 1 via ORAL
  Filled 2016-02-08: qty 1
  Filled 2016-02-08: qty 2

## 2016-02-08 MED ORDER — LIDOCAINE HCL (CARDIAC) 20 MG/ML IV SOLN
INTRAVENOUS | Status: DC | PRN
  Administered 2016-02-08: 100 mg via INTRAVENOUS

## 2016-02-08 MED ORDER — ROCURONIUM BROMIDE 100 MG/10ML IV SOLN
INTRAVENOUS | Status: AC
  Filled 2016-02-08: qty 1

## 2016-02-08 MED ORDER — CEFAZOLIN SODIUM-DEXTROSE 2-3 GM-% IV SOLR
INTRAVENOUS | Status: AC
  Filled 2016-02-08: qty 50

## 2016-02-08 MED ORDER — SODIUM CHLORIDE 0.9 % IR SOLN
Status: DC | PRN
  Administered 2016-02-08: 2000 mL

## 2016-02-08 MED ORDER — HYDROMORPHONE HCL 1 MG/ML IJ SOLN
INTRAMUSCULAR | Status: AC
Start: 2016-02-08 — End: 2016-02-08
  Filled 2016-02-08: qty 1

## 2016-02-08 MED ORDER — MICROFIBRILLAR COLL HEMOSTAT EX POWD
CUTANEOUS | Status: AC
  Filled 2016-02-08: qty 5

## 2016-02-08 MED ORDER — NEOSTIGMINE METHYLSULFATE 10 MG/10ML IV SOLN
INTRAVENOUS | Status: DC | PRN
  Administered 2016-02-08: 3.5 mg via INTRAVENOUS

## 2016-02-08 MED ORDER — DEXAMETHASONE SODIUM PHOSPHATE 10 MG/ML IJ SOLN
INTRAMUSCULAR | Status: DC | PRN
  Administered 2016-02-08: 8 mg via INTRAVENOUS

## 2016-02-08 MED ORDER — LEVOTHYROXINE SODIUM 25 MCG PO TABS
25.0000 ug | ORAL_TABLET | Freq: Every day | ORAL | Status: DC
  Administered 2016-02-09: 25 ug via ORAL
  Filled 2016-02-08 (×2): qty 1

## 2016-02-08 MED ORDER — LACTATED RINGERS IV SOLN
INTRAVENOUS | Status: DC
  Administered 2016-02-08 (×3): via INTRAVENOUS

## 2016-02-08 MED ORDER — NEOSTIGMINE METHYLSULFATE 10 MG/10ML IV SOLN
INTRAVENOUS | Status: AC
  Filled 2016-02-08: qty 1

## 2016-02-08 MED ORDER — MIDAZOLAM HCL 2 MG/2ML IJ SOLN
INTRAMUSCULAR | Status: AC
  Filled 2016-02-08: qty 2

## 2016-02-08 MED ORDER — HYDROMORPHONE HCL 1 MG/ML IJ SOLN
0.2000 mg | INTRAMUSCULAR | Status: DC | PRN
  Administered 2016-02-08: 1 mg via INTRAVENOUS
  Filled 2016-02-08: qty 1

## 2016-02-08 MED ORDER — KETOROLAC TROMETHAMINE 30 MG/ML IJ SOLN
30.0000 mg | Freq: Four times a day (QID) | INTRAMUSCULAR | Status: DC
  Administered 2016-02-08 – 2016-02-09 (×3): 30 mg via INTRAVENOUS
  Filled 2016-02-08 (×4): qty 1

## 2016-02-08 MED ORDER — FENTANYL CITRATE (PF) 100 MCG/2ML IJ SOLN
INTRAMUSCULAR | Status: DC | PRN
  Administered 2016-02-08 (×3): 50 ug via INTRAVENOUS
  Administered 2016-02-08: 100 ug via INTRAVENOUS
  Administered 2016-02-08 (×4): 50 ug via INTRAVENOUS

## 2016-02-08 MED ORDER — KETOROLAC TROMETHAMINE 30 MG/ML IJ SOLN
INTRAMUSCULAR | Status: AC
  Filled 2016-02-08: qty 1

## 2016-02-08 MED ORDER — HYDROMORPHONE HCL 1 MG/ML IJ SOLN
INTRAMUSCULAR | Status: AC
  Filled 2016-02-08: qty 1

## 2016-02-08 MED ORDER — HYDROMORPHONE HCL 1 MG/ML IJ SOLN
0.2500 mg | INTRAMUSCULAR | Status: DC | PRN
  Administered 2016-02-08: 0.25 mg via INTRAVENOUS

## 2016-02-08 MED ORDER — GLYCOPYRROLATE 0.2 MG/ML IJ SOLN
INTRAMUSCULAR | Status: AC
  Filled 2016-02-08: qty 3

## 2016-02-08 SURGICAL SUPPLY — 35 items
BARRIER ADHS 3X4 INTERCEED (GAUZE/BANDAGES/DRESSINGS) ×2 IMPLANT
CANISTER SUCT 3000ML (MISCELLANEOUS) ×2 IMPLANT
CHLORAPREP W/TINT 26ML (MISCELLANEOUS) ×2 IMPLANT
CLOTH BEACON ORANGE TIMEOUT ST (SAFETY) ×2 IMPLANT
CONT PATH 16OZ SNAP LID 3702 (MISCELLANEOUS) ×2 IMPLANT
DECANTER SPIKE VIAL GLASS SM (MISCELLANEOUS) ×2 IMPLANT
DRSG OPSITE POSTOP 4X10 (GAUZE/BANDAGES/DRESSINGS) ×2 IMPLANT
GAUZE SPONGE 4X4 16PLY XRAY LF (GAUZE/BANDAGES/DRESSINGS) ×4 IMPLANT
GLOVE BIO SURGEON STRL SZ8.5 (GLOVE) ×2 IMPLANT
GLOVE BIOGEL PI IND STRL 7.0 (GLOVE) ×1 IMPLANT
GLOVE BIOGEL PI INDICATOR 7.0 (GLOVE) ×1
GOWN STRL REUS W/TWL 2XL LVL3 (GOWN DISPOSABLE) ×2 IMPLANT
GOWN STRL REUS W/TWL LRG LVL3 (GOWN DISPOSABLE) ×2 IMPLANT
HEMOSTAT ARISTA ABSORB 3G PWDR (MISCELLANEOUS) ×2 IMPLANT
NEEDLE HYPO 22GX1.5 SAFETY (NEEDLE) ×2 IMPLANT
NS IRRIG 1000ML POUR BTL (IV SOLUTION) ×2 IMPLANT
PACK ABDOMINAL GYN (CUSTOM PROCEDURE TRAY) ×2 IMPLANT
PAD OB MATERNITY 4.3X12.25 (PERSONAL CARE ITEMS) ×2 IMPLANT
PENCIL SMOKE EVAC W/HOLSTER (ELECTROSURGICAL) ×2 IMPLANT
STAPLER VISISTAT 35W (STAPLE) ×2 IMPLANT
SUT CHROMIC 0 CT 1 (SUTURE) IMPLANT
SUT CHROMIC 1 (SUTURE) IMPLANT
SUT CHROMIC 1 CTX 36 (SUTURE) ×8 IMPLANT
SUT CHROMIC 1MO 4 18 CR8 (SUTURE) ×6 IMPLANT
SUT CHROMIC 2 0 SH (SUTURE) ×4 IMPLANT
SUT MON AB 4-0 PS1 27 (SUTURE) ×2 IMPLANT
SUT PLAIN 2 0 XLH (SUTURE) IMPLANT
SUT VIC AB 0 CT1 18XCR BRD8 (SUTURE) IMPLANT
SUT VIC AB 0 CT1 27 (SUTURE) ×2
SUT VIC AB 0 CT1 27XBRD ANBCTR (SUTURE) ×1 IMPLANT
SUT VIC AB 0 CT1 8-18 (SUTURE)
SYR CONTROL 10ML LL (SYRINGE) ×2 IMPLANT
TOWEL OR 17X24 6PK STRL BLUE (TOWEL DISPOSABLE) ×4 IMPLANT
TRAY FOLEY CATH SILVER 14FR (SET/KITS/TRAYS/PACK) ×2 IMPLANT
WATER STERILE IRR 1000ML POUR (IV SOLUTION) ×2 IMPLANT

## 2016-02-08 NOTE — Anesthesia Postprocedure Evaluation (Signed)
Anesthesia Post Note  Patient: Kendra Zimmerman  Procedure(s) Performed: Procedure(s) (LRB): ABDOMINAL MYOMECTOMY (N/A)  Patient location during evaluation: [facility] Unit Anesthesia Type: General Level of consciousness: sedated Pain management: pain level controlled Vital Signs Assessment: post-procedure vital signs reviewed and stable Respiratory status: spontaneous breathing Cardiovascular status: stable Postop Assessment: no signs of nausea or vomiting and adequate PO intake Anesthetic complications: no    Last Vitals:  Filed Vitals:   02/08/16 1330 02/08/16 1430  BP: 120/79 119/75  Pulse: 74 79  Temp: 36.6 C 36.8 C  Resp: 16 16    Last Pain:  Filed Vitals:   02/08/16 1501  PainSc: 1                  Katelin Kutsch

## 2016-02-08 NOTE — Transfer of Care (Signed)
Immediate Anesthesia Transfer of Care Note  Patient: Kendra Zimmerman  Procedure(s) Performed: Procedure(s): ABDOMINAL MYOMECTOMY (N/A)  Patient Location: PACU  Anesthesia Type:General  Level of Consciousness: awake, alert , oriented and patient cooperative  Airway & Oxygen Therapy: Patient Spontanous Breathing and Patient connected to nasal cannula oxygen  Post-op Assessment: Report given to RN and Post -op Vital signs reviewed and stable  Post vital signs: Reviewed and stable  Last Vitals:  Filed Vitals:   02/08/16 0742  BP: 133/93  Pulse: 78  Temp: 36.8 C  Resp: 18    Complications: No apparent anesthesia complications

## 2016-02-08 NOTE — Anesthesia Procedure Notes (Addendum)
Procedure Name: Intubation Date/Time: 02/08/2016 9:01 AM Performed by: Georgeanne Nim Pre-anesthesia Checklist: Patient identified, Patient being monitored, Timeout performed, Emergency Drugs available and Suction available Patient Re-evaluated:Patient Re-evaluated prior to inductionOxygen Delivery Method: Circle system utilized Preoxygenation: Pre-oxygenation with 100% oxygen Intubation Type: IV induction Ventilation: Mask ventilation without difficulty Laryngoscope Size: Mac and 3 Grade View: Grade I Tube type: Oral Tube size: 7.0 mm Number of attempts: 1 Airway Equipment and Method: Stylet Placement Confirmation: ETT inserted through vocal cords under direct vision,  breath sounds checked- equal and bilateral,  positive ETCO2 and CO2 detector Secured at: 21 cm Tube secured with: Tape

## 2016-02-08 NOTE — Op Note (Signed)
Preop diagnosis myoma uteri Postop diagnosis multiple myomectomy Surgeon Dr. Gracy Racer First assistant Dr. Brantley Persons Procedure  Under  general anesthesia patient in the supine position abdomen prepped and draped bladder emptied with a Foley catheter a transverse suprapubic incision made carried   to the rectus fascia fascia incised length of the incision recti muscles retracted laterally peritoneum incised longitudinally she had 20 week size myomatous uterus was exteriorized   a large posterior myoma both 10 x 10 and uterine capsule was incised using blunt and sharp dissection the myoma was removed hemostasis was established was achieved and with #1 chromic in 3 layers and then on the fundus was 5 cm myoma and this was removed in the usual fashion and 2 other myomas removed similarly hemostasis achieved and 2 layers of continuous #1 suture blood loss was 1400 cc   ovaries were normal both  Tubes  had previously been cut and Avitene was placed over the areas where myomas had been removed and a lap and sponge counts correct abdomen closed in layers peritoneum continuous double chromic fascia continuous with  0 dexon  skin closed  subcuticular stitch of 4-0 Monocryl patient tolerated procedure well taken to recovery room in good condition

## 2016-02-08 NOTE — Anesthesia Postprocedure Evaluation (Signed)
Anesthesia Post Note  Patient: Kendra Zimmerman  Procedure(s) Performed: Procedure(s) (LRB): ABDOMINAL MYOMECTOMY (N/A)  Patient location during evaluation: PACU Anesthesia Type: General Level of consciousness: awake Pain management: pain level controlled Vital Signs Assessment: post-procedure vital signs reviewed and stable Respiratory status: spontaneous breathing Cardiovascular status: stable Postop Assessment: no signs of nausea or vomiting Anesthetic complications: no    Last Vitals:  Filed Vitals:   02/08/16 1430 02/08/16 1805  BP: 119/75 118/80  Pulse: 79 78  Temp: 36.8 C 36.6 C  Resp: 16 16    Last Pain:  Filed Vitals:   02/08/16 1923  PainSc: [place]

## 2016-02-08 NOTE — Anesthesia Preprocedure Evaluation (Signed)
Anesthesia Evaluation  Patient identified by MRN, date of birth, ID band Patient awake    Reviewed: Allergy & Precautions, NPO status , Patient's Chart, lab work & pertinent test results  Airway Mallampati: II  TM Distance: >3 FB Neck ROM: Full    Dental no notable dental hx.    Pulmonary neg pulmonary ROS,    Pulmonary exam normal breath sounds clear to auscultation       Cardiovascular hypertension, Pt. on medications Normal cardiovascular exam Rhythm:Regular Rate:Normal     Neuro/Psych Anxiety negative neurological ROS     GI/Hepatic negative GI ROS, Neg liver ROS,   Endo/Other  Hyperthyroidism   Renal/GU negative Renal ROS  negative genitourinary   Musculoskeletal negative musculoskeletal ROS (+)   Abdominal   Peds negative pediatric ROS (+)  Hematology negative hematology ROS (+)   Anesthesia Other Findings   Reproductive/Obstetrics negative OB ROS                             Anesthesia Physical Anesthesia Plan  ASA: II  Anesthesia Plan: General   Post-op Pain Management:    Induction: Intravenous  Airway Management Planned: Oral ETT  Additional Equipment:   Intra-op Plan:   Post-operative Plan: Extubation in OR  Informed Consent: I have reviewed the patients History and Physical, chart, labs and discussed the procedure including the risks, benefits and alternatives for the proposed anesthesia with the patient or authorized representative who has indicated his/her understanding and acceptance.   Dental advisory given  Plan Discussed with: CRNA  Anesthesia Plan Comments:         Anesthesia Quick Evaluation

## 2016-02-08 NOTE — H&P (Signed)
  There has been no change in the history and physical since the original dictation 

## 2016-02-09 ENCOUNTER — Encounter (HOSPITAL_COMMUNITY): Payer: Self-pay | Admitting: Obstetrics

## 2016-02-09 LAB — CBC
HEMATOCRIT: 24.7 % — AB (ref 36.0–46.0)
Hemoglobin: 8.7 g/dL — ABNORMAL LOW (ref 12.0–15.0)
MCH: 30.3 pg (ref 26.0–34.0)
MCHC: 35.2 g/dL (ref 30.0–36.0)
MCV: 86.1 fL (ref 78.0–100.0)
Platelets: 250 10*3/uL (ref 150–400)
RBC: 2.87 MIL/uL — ABNORMAL LOW (ref 3.87–5.11)
RDW: 12.9 % (ref 11.5–15.5)
WBC: 9.7 10*3/uL (ref 4.0–10.5)

## 2016-02-09 NOTE — Progress Notes (Signed)
Patient ID: Kendra Zimmerman, female   DOB: [birth year], 45 y.o.   MRN: [id number] Post op day one Last blood pressure 180/70 of previous blood pressures were normal pulse 100 respirations 16 patient has no complaints Abdomen soft bowel sounds present Output good legs negative regular diet today  Dc iv and  Foley doing well

## 2016-02-10 MED ORDER — IBUPROFEN 800 MG PO TABS: 800.0000 mg | ORAL_TABLET | Freq: Three times a day (TID) | ORAL | Status: DC | PRN

## 2016-02-10 MED ORDER — OXYCODONE-ACETAMINOPHEN 5-325 MG PO TABS: 1.0000 | ORAL_TABLET | ORAL | Status: DC | PRN

## 2016-02-10 NOTE — Discharge Instructions (Signed)
Hysterectomy, Abdominal  °Care After °Please read the instructions below. Refer to these instructions for the next few weeks. These instructions provide you with general information on caring for yourself after surgery. Your caregiver may also give you specific instructions. While your treatment has been planned according to the most current medical practices available, unavoidable problems sometimes happen. If you have any problems or questions after you leave, please call your caregiver. °HOME CARE INSTRUCTIONS  °Healing will take time. You will have discomfort, tenderness, swelling and bruising at the operative site for a couple of weeks. This is normal and will get better as time goes on.  °· Only take over-the-counter or prescription medicines for pain, discomfort or fever as directed by your caregiver.  °· Do not take aspirin. It can cause bleeding.  °· Do not drive when taking pain medication.  °· Follow your caregiver's advice regarding diet, exercise, lifting, driving and general activities.  °· Resume your usual diet as directed and allowed.  °· Get plenty of rest and sleep.  °· Do not douche, use tampons, or have sexual intercourse until your caregiver gives you permission.  °· Change your bandages (dressings) as directed.  °· Take your temperature twice a day. Write it down.  °· Your caregiver may recommend showers instead of baths for a few weeks.  °· Do not drink alcohol until your caregiver gives you permission.  °· If you develop constipation, you may take a mild laxative with your caregiver's permission. Bran foods and drinking fluids helps with constipation problems.  °· Try to have someone home with you for a week or two to help with the household activities.  °· Make sure you and your family understands everything about your operation and recovery.  °· Do not sign any legal documents until you feel normal again.  °· Keep all your follow-up appointments as recommended by your caregiver.  °SEEK  MEDICAL CARE IF:  °· There is swelling, redness or increasing pain in the wound area.  °· Pus is coming from the wound.  °· You notice a bad smell from the wound or surgical dressing.  °· You have pain, redness and swelling from the intravenous site.  °· The wound is breaking open (the edges are not staying together).  °· You feel dizzy or feel like fainting.  °· You develop pain or bleeding when you urinate.  °· You develop diarrhea.  °· You develop nausea and vomiting.  °· You develop abnormal vaginal discharge.  °· You develop a rash.  °· You have any type of abnormal reaction or develop an allergy to your medication.  °· You need stronger pain medication for your pain.  °SEEK IMMEDIATE MEDICAL CARE IF: °· You have a fever.  °· You develop abdominal pain.  °· You develop chest pain.  °· You develop shortness of breath.  °· You pass out.  °· You develop pain, swelling or redness of your leg.  °· You develop heavy vaginal bleeding with or without blood clots.  °Document Released: 06/22/2005 Document Revised: 06/08/2011 Document Reviewed: 09/04/2009 °[employer]® Patient Information ©2012 [employer]. °

## 2016-02-10 NOTE — Progress Notes (Signed)
Pt ambulated out with RN .   Pt states she has left the building  And taken her boyfriend home   . Informed pt she left without  Proper d/c  Instructions given   Incisional care reviewed   Reported incident to  House coverage

## 2016-02-10 NOTE — Progress Notes (Signed)
Patient ID: Kendra Zimmerman, female   DOB: 01-18-1971, 45 y.o.   MRN: [id number] Postop day 2 Blood pressure 140/93 pulse 100 respiration 18 Patient dressed says she is going home today she is passing yes she's got good bowel sounds her incision is clean and dry legs are negative she been discharged today to see me in 2 weeks

## 2016-02-10 NOTE — Discharge Summary (Signed)
  Patient was admitted because of 20 week myomas and her desire for myomectomy she underwent multiple myomectomy and postoperatively did well she was discharged on the second postoperative day to see me in 2 weeks she is on Synthroid and lisinopril for her blood pressure

## 2016-08-06 ENCOUNTER — Emergency Department (HOSPITAL_COMMUNITY)
Admission: EM | Admit: 2016-08-06 | Discharge: 2016-08-06 | Disposition: A | Discharge status: Home / Self Care | Payer: Medicaid Other | Attending: Emergency Medicine | Admitting: Emergency Medicine

## 2016-08-06 ENCOUNTER — Encounter (HOSPITAL_COMMUNITY): Payer: Self-pay | Admitting: Nurse Practitioner

## 2016-08-06 DIAGNOSIS — Z5321 Procedure and treatment not carried out due to patient leaving prior to being seen by health care provider: Secondary | ICD-10-CM

## 2016-08-06 DIAGNOSIS — I10 Essential (primary) hypertension: Secondary | ICD-10-CM | POA: Diagnosis not present

## 2016-08-06 DIAGNOSIS — M79604 Pain in right leg: Secondary | ICD-10-CM | POA: Insufficient documentation

## 2016-08-06 DIAGNOSIS — Z79899 Other long term (current) drug therapy: Secondary | ICD-10-CM | POA: Insufficient documentation

## 2016-08-06 NOTE — ED Triage Notes (Signed)
She c/o R foot and ankle swelling and pain. Onset after she felt a bee sting her in the back of her leg while mowing yard yesterday. She did not see the bee. She applied baking soda and water paste, anti itch cream with no relief.

## 2016-08-06 NOTE — ED Triage Notes (Signed)
PT absent from room 

## 2016-08-06 NOTE — ED Provider Notes (Signed)
Patient not in treatment room or in sub-waiting. Staff not able to locate patient in main waiting room.   Etta Quill, NP 08/06/16 1606

## 2016-08-13 ENCOUNTER — Ambulatory Visit (INDEPENDENT_AMBULATORY_CARE_PROVIDER_SITE_OTHER): Payer: Medicaid Other | Admitting: Obstetrics & Gynecology

## 2016-08-13 ENCOUNTER — Encounter: Payer: Self-pay | Admitting: Obstetrics & Gynecology

## 2016-08-13 VITALS — BP 138/96 | HR 80 | Temp 98.3°F | Ht 64.0 in | Wt 176.7 lb

## 2016-08-13 DIAGNOSIS — N946 Dysmenorrhea, unspecified: Secondary | ICD-10-CM | POA: Diagnosis not present

## 2016-08-13 DIAGNOSIS — D251 Intramural leiomyoma of uterus: Secondary | ICD-10-CM

## 2016-08-13 NOTE — Progress Notes (Signed)
Patient ID: Kendra Zimmerman, female   DOB: [birth year], 45 y.o.   MRN: [id number]  Chief Complaint  Patient presents with  . Establish Care  . Follow-up    myomectomy 01/2016  . Dysmenorrhea    extremely painful per pt, takes ibuprofen-does not help    HPI Kendra Zimmerman is a 45 y.o. female.  [id number] Patient's last menstrual period was 07/30/2016 (approximate). Menses are painful, s/p myomectomy 01/2016 HPI  Past Medical History:  Diagnosis Date  . Alcohol abuse    sober 20 months  . Anxiety   . Chlamydia   . Hypertension   . Hyperthyroidism   . Syphilis   . Trichomonas     Past Surgical History:  Procedure Laterality Date  . INDUCED ABORTION    . MYOMECTOMY N/A 02/08/2016   Procedure: ABDOMINAL MYOMECTOMY;  Surgeon: Frederico Hamman, MD;  Location: [facility];  Service: Gynecology;  Laterality: N/A;  . NOVASURE ABLATION     2010  . TUBAL LIGATION      Family History  Problem Relation Age of Onset  . Alzheimer's disease Paternal Grandmother   . Hypertension Maternal Grandmother   . Alcohol abuse Maternal Grandmother   . Anesthesia problems Neg Hx   . Hypotension Neg Hx   . Malignant hyperthermia Neg Hx   . Pseudochol deficiency Neg Hx     Social History Social History  Substance Use Topics  . Smoking status: Never Smoker  . Smokeless tobacco: Never Used  . Alcohol use No     Comment: hx alcohol use but has stopped    No Known Allergies  Current Outpatient Prescriptions  Medication Sig Dispense Refill  . ibuprofen (ADVIL,MOTRIN) 800 MG tablet Take 1 tablet (800 mg total) by mouth every 8 (eight) hours as needed (mild pain). 30 tablet 0  . levothyroxine (SYNTHROID, LEVOTHROID) 25 MCG tablet Take 25 mcg by mouth daily before breakfast.    . lisinopril (PRINIVIL,ZESTRIL) 20 MG tablet Take 20 mg by mouth daily.     No current facility-administered medications for this visit.     Review of Systems Review of Systems  Constitutional: Negative.   Respiratory:  Negative.   Genitourinary: Positive for menstrual problem (flow for only 3 days not heavy) and pelvic pain (cramps during menses).    Blood pressure (!) 138/96, pulse 80, temperature 98.3 F (36.8 C), temperature source Oral, height 5\' 4"  (1.626 m), weight 176 lb 11.2 oz (80.2 kg), last menstrual period 07/30/2016.  Physical Exam Physical Exam  Constitutional: She appears well-developed. No distress.  Pulmonary/Chest: Effort normal.  Genitourinary: Vagina normal.  Genitourinary Comments: Uterus 6+ weeks size not tender  Skin: Skin is warm and dry.  Psychiatric: She has a normal mood and affect. Her behavior is normal.  Vitals reviewed.   Data Reviewed [place] and op note  Assessment    Patient Active Problem List   Diagnosis Date Noted  . Fibroids 02/08/2016   dysmenorrhea    Plan          Pelvic US, RTC to review. Consider Mirena for menstrual symptom relief ARNOLD,JAMES 08/13/2016, 2:10 PM

## 2016-08-13 NOTE — Patient Instructions (Signed)
Levonorgestrel intrauterine device (IUD) What is this medicine? LEVONORGESTREL IUD (LEE voe nor jes trel) is a contraceptive (birth control) device. The device is placed inside the uterus by a healthcare professional. It is used to prevent pregnancy and can also be used to treat heavy bleeding that occurs during your period. Depending on the device, it can be used for 3 to 5 years. This medicine may be used for other purposes; ask your health care provider or pharmacist if you have questions. What should I tell my health care provider before I take this medicine? They need to know if you have any of these conditions: -abnormal Pap smear -cancer of the breast, uterus, or cervix -diabetes -endometritis -genital or pelvic infection now or in the past -have more than one sexual partner or your partner has more than one partner -heart disease -history of an ectopic or tubal pregnancy -immune system problems -IUD in place -liver disease or tumor -problems with blood clots or take blood-thinners -use intravenous drugs -uterus of unusual shape -vaginal bleeding that has not been explained -an unusual or allergic reaction to levonorgestrel, other hormones, silicone, or polyethylene, medicines, foods, dyes, or preservatives -pregnant or trying to get pregnant -breast-feeding How should I use this medicine? This device is placed inside the uterus by a health care professional. Talk to your pediatrician regarding the use of this medicine in children. Special care may be needed. Overdosage: If you think you have taken too much of this medicine contact a poison control center or emergency room at once. NOTE: This medicine is only for you. Do not share this medicine with others. What if I miss a dose? This does not apply. What may interact with this medicine? Do not take this medicine with any of the following medications: -amprenavir -bosentan -fosamprenavir This medicine may also interact with  the following medications: -aprepitant -barbiturate medicines for inducing sleep or treating seizures -bexarotene -griseofulvin -medicines to treat seizures like carbamazepine, ethotoin, felbamate, oxcarbazepine, phenytoin, topiramate -modafinil -pioglitazone -rifabutin -rifampin -rifapentine -some medicines to treat HIV infection like atazanavir, indinavir, lopinavir, nelfinavir, tipranavir, ritonavir -St. John's wort -warfarin This list may not describe all possible interactions. Give your health care provider a list of all the medicines, herbs, non-prescription drugs, or dietary supplements you use. Also tell them if you smoke, drink alcohol, or use illegal drugs. Some items may interact with your medicine. What should I watch for while using this medicine? Visit your doctor or health care professional for regular check ups. See your doctor if you or your partner has sexual contact with others, becomes HIV positive, or gets a sexual transmitted disease. This product does not protect you against HIV infection (AIDS) or other sexually transmitted diseases. You can check the placement of the IUD yourself by reaching up to the top of your vagina with clean fingers to feel the threads. Do not pull on the threads. It is a good habit to check placement after each menstrual period. Call your doctor right away if you feel more of the IUD than just the threads or if you cannot feel the threads at all. The IUD may come out by itself. You may become pregnant if the device comes out. If you notice that the IUD has come out use a backup birth control method like condoms and call your health care provider. Using tampons will not change the position of the IUD and are okay to use during your period. What side effects may I notice from receiving this medicine?   Side effects that you should report to your doctor or health care professional as soon as possible: -allergic reactions like skin rash, itching or  hives, swelling of the face, lips, or tongue -fever, flu-like symptoms -genital sores -high blood pressure -no menstrual period for 6 weeks during use -pain, swelling, warmth in the leg -pelvic pain or tenderness -severe or sudden headache -signs of pregnancy -stomach cramping -sudden shortness of breath -trouble with balance, talking, or walking -unusual vaginal bleeding, discharge -yellowing of the eyes or skin Side effects that usually do not require medical attention (report to your doctor or health care professional if they continue or are bothersome): -acne -breast pain -change in sex drive or performance -changes in weight -cramping, dizziness, or faintness while the device is being inserted -headache -irregular menstrual bleeding within first 3 to 6 months of use -nausea This list may not describe all possible side effects. Call your doctor for medical advice about side effects. You may report side effects to FDA at 1-800-FDA-1088. Where should I keep my medicine? This does not apply. NOTE: This sheet is a summary. It may not cover all possible information. If you have questions about this medicine, talk to your doctor, pharmacist, or health care provider.    2016, Elsevier/Gold Standard. (2012-01-03 13:54:04)  

## 2016-09-06 ENCOUNTER — Ambulatory Visit: Payer: Medicaid Other | Admitting: Obstetrics and Gynecology

## 2016-09-06 ENCOUNTER — Other Ambulatory Visit: Payer: Medicaid Other

## 2016-09-11 ENCOUNTER — Encounter: Payer: Self-pay | Admitting: *Deleted

## 2016-09-15 ENCOUNTER — Encounter (HOSPITAL_COMMUNITY): Payer: Self-pay | Admitting: *Deleted

## 2016-09-15 ENCOUNTER — Inpatient Hospital Stay (HOSPITAL_COMMUNITY)
Admission: AD | Admit: 2016-09-15 | Discharge: 2016-09-15 | Disposition: A | Discharge status: Home / Self Care | Payer: Medicaid Other | Source: Ambulatory Visit | Attending: Obstetrics & Gynecology | Admitting: Obstetrics & Gynecology

## 2016-09-15 DIAGNOSIS — I10 Essential (primary) hypertension: Secondary | ICD-10-CM | POA: Diagnosis not present

## 2016-09-15 DIAGNOSIS — E0590 Thyrotoxicosis, unspecified without thyrotoxic crisis or storm: Secondary | ICD-10-CM | POA: Diagnosis not present

## 2016-09-15 DIAGNOSIS — R110 Nausea: Secondary | ICD-10-CM | POA: Diagnosis not present

## 2016-09-15 DIAGNOSIS — R112 Nausea with vomiting, unspecified: Secondary | ICD-10-CM | POA: Diagnosis not present

## 2016-09-15 DIAGNOSIS — N946 Dysmenorrhea, unspecified: Secondary | ICD-10-CM | POA: Insufficient documentation

## 2016-09-15 DIAGNOSIS — R109 Unspecified abdominal pain: Secondary | ICD-10-CM | POA: Insufficient documentation

## 2016-09-15 LAB — CBC
HEMATOCRIT: 37.6 % (ref 36.0–46.0)
Hemoglobin: 13.2 g/dL (ref 12.0–15.0)
MCH: 29.7 pg (ref 26.0–34.0)
MCHC: 35.1 g/dL (ref 30.0–36.0)
MCV: 84.5 fL (ref 78.0–100.0)
Platelets: 350 10*3/uL (ref 150–400)
RBC: 4.45 MIL/uL (ref 3.87–5.11)
RDW: 13.2 % (ref 11.5–15.5)
WBC: 7.8 10*3/uL (ref 4.0–10.5)

## 2016-09-15 MED ORDER — ONDANSETRON 4 MG PO TBDP
4.0000 mg | ORAL_TABLET | Freq: Once | ORAL | Status: AC
  Administered 2016-09-15: 4 mg via ORAL
  Filled 2016-09-15: qty 1

## 2016-09-15 MED ORDER — MEGESTROL ACETATE 40 MG PO TABS: 40.0000 mg | ORAL_TABLET | Freq: Every day | ORAL | 0 refills | Status: DC

## 2016-09-15 MED ORDER — IBUPROFEN 800 MG PO TABS: 800.0000 mg | ORAL_TABLET | Freq: Three times a day (TID) | ORAL | 1 refills | Status: DC | PRN

## 2016-09-15 MED ORDER — KETOROLAC TROMETHAMINE 60 MG/2ML IM SOLN
60.0000 mg | Freq: Once | INTRAMUSCULAR | Status: AC
  Administered 2016-09-15: 60 mg via INTRAMUSCULAR
  Filled 2016-09-15: qty 2

## 2016-09-15 MED ORDER — ONDANSETRON 4 MG PO TBDP: 4.0000 mg | ORAL_TABLET | Freq: Three times a day (TID) | ORAL | 0 refills | Status: DC | PRN

## 2016-09-15 MED ORDER — MEGESTROL ACETATE 40 MG PO TABS
40.0000 mg | ORAL_TABLET | Freq: Every day | ORAL | Status: DC
  Administered 2016-09-15: 40 mg via ORAL
  Filled 2016-09-15 (×2): qty 1

## 2016-09-15 MED ORDER — LISINOPRIL 40 MG PO TABS
40.0000 mg | ORAL_TABLET | Freq: Once | ORAL | Status: AC
  Administered 2016-09-15: 40 mg via ORAL
  Filled 2016-09-15: qty 1

## 2016-09-15 NOTE — Discharge Instructions (Signed)

## 2016-09-15 NOTE — MAU Provider Note (Signed)
History     CSN: [id number]  Arrival date and time: 09/15/16 1750   First Provider Initiated Contact with Patient 09/15/16 1807      Chief Complaint  Patient presents with  . Emesis   HPI   [name] is a 45 y.o. female here in MAU with N/V, Abdominal pain and vaginal bleeding. She started her menstrual cycle yesterday and today started experiencing lower abdominal pain. She has a history of dysmenorrhea, and uterine fibroids,  and is scheduled for a pelvic US on Oct 5th. She has tried taking ibuprofen and tylenol today, however has not been able to keep it down.   Since her surgery (myomectomy) she has had very painful periods.   OB History    Gravida Para Term Preterm AB Living   9 6 6   3 6    SAB TAB Ectopic Multiple Live Births   0 3            Past Medical History:  Diagnosis Date  . Alcohol abuse    sober 20 months  . Anxiety   . Chlamydia   . Hypertension   . Hyperthyroidism   . Syphilis   . Trichomonas     Past Surgical History:  Procedure Laterality Date  . INDUCED ABORTION    . MYOMECTOMY N/A 02/08/2016   Procedure: ABDOMINAL MYOMECTOMY;  Surgeon: Frederico Hamman, MD;  Location: [facility];  Service: Gynecology;  Laterality: N/A;  . NOVASURE ABLATION     2010  . TUBAL LIGATION      Family History  Problem Relation Age of Onset  . Alzheimer's disease Paternal Grandmother   . Hypertension Maternal Grandmother   . Alcohol abuse Maternal Grandmother   . Anesthesia problems Neg Hx   . Hypotension Neg Hx   . Malignant hyperthermia Neg Hx   . Pseudochol deficiency Neg Hx     Social History  Substance Use Topics  . Smoking status: Never Smoker  . Smokeless tobacco: Never Used  . Alcohol use No     Comment: hx alcohol use but has stopped    Allergies: No Known Allergies  Prescriptions Prior to Admission  Medication Sig Dispense Refill Last Dose  . ibuprofen (ADVIL,MOTRIN) 800 MG tablet Take 1 tablet (800 mg total) by mouth every 8  (eight) hours as needed (mild pain). 30 tablet 0 Taking  . levothyroxine (SYNTHROID, LEVOTHROID) 25 MCG tablet Take 25 mcg by mouth daily before breakfast.   Taking  . lisinopril (PRINIVIL,ZESTRIL) 20 MG tablet Take 20 mg by mouth daily.   Taking   No results found for this or any previous visit (from the past 48 hour(s)).   No results found for this or any previous visit (from the past 48 hour(s)).  Review of Systems  Constitutional: Negative for chills and fever.  Gastrointestinal: Positive for abdominal pain, nausea and vomiting.   Physical Exam   Blood pressure 148/80, pulse 73, temperature 99 F (37.2 C), temperature source Oral, resp. rate 18, height 5\' 3"  (1.6 m), weight 168 lb (76.2 kg), last menstrual period 09/15/2016.  Physical Exam  Constitutional: She is oriented to person, place, and time. She appears well-developed and well-nourished. No distress.  Respiratory: Effort normal.  GI: Soft. She exhibits no distension. There is no tenderness. There is no rebound.  Genitourinary:  Genitourinary Comments: Vagina - Small amount of dark red blood in the vagina, no odor  Cervix - no active bleeding  Bimanual exam: Cervix closed Uterus non  tender, normal size Adnexa non tender, no masses bilaterally Chaperone present for exam.   Musculoskeletal: Normal range of motion.  Neurological: She is alert and oriented to person, place, and time.  Skin: Skin is warm. She is not diaphoretic.  Psychiatric: Her behavior is normal.    MAU Course  Procedures  None  MDM  Zofran 4 mg  Toradol 60 mg IM Lisinopril 40 PO Megace 40 mg PO   Patient reports her pain at 0/10, nausea is gone. BP down to 148/80 Patient denies dizziness.   Assessment and Plan   A:  1. Nausea and vomiting in adult patient   2. Abdominal cramping   3. Painful menstruation   4. Essential hypertension     P:  Discharge home In stable condition Keep follow up appointment that is scheduled Return  precautions discussed in detail  Rx: Zofran, Megace.  Bleeding precautions    Lezlie Lye, NP 09/17/2016 11:16 AM

## 2016-09-15 NOTE — MAU Note (Addendum)
Pt presents to MAU with complaints of vomiting since early this morning. (5x). Reports heavy vaginal bleeding. Lower abdominal cramping. Pt has not taken blood pressure medication today

## 2016-09-17 ENCOUNTER — Other Ambulatory Visit: Payer: Self-pay | Admitting: Obstetrics & Gynecology

## 2016-09-17 DIAGNOSIS — N946 Dysmenorrhea, unspecified: Secondary | ICD-10-CM

## 2016-09-18 ENCOUNTER — Other Ambulatory Visit: Payer: Self-pay | Admitting: Obstetrics & Gynecology

## 2016-09-20 ENCOUNTER — Encounter: Payer: Self-pay | Admitting: *Deleted

## 2016-09-20 ENCOUNTER — Encounter: Payer: Self-pay | Admitting: Obstetrics

## 2016-09-20 ENCOUNTER — Ambulatory Visit (INDEPENDENT_AMBULATORY_CARE_PROVIDER_SITE_OTHER): Payer: Medicaid Other

## 2016-09-20 ENCOUNTER — Ambulatory Visit: Payer: Medicaid Other | Admitting: Obstetrics

## 2016-09-20 VITALS — BP 155/103 | HR 78 | Temp 98.4°F | Wt 175.8 lb

## 2016-09-20 DIAGNOSIS — N946 Dysmenorrhea, unspecified: Secondary | ICD-10-CM

## 2016-09-24 ENCOUNTER — Other Ambulatory Visit: Payer: Self-pay | Admitting: Obstetrics & Gynecology

## 2016-09-24 DIAGNOSIS — Z1231 Encounter for screening mammogram for malignant neoplasm of breast: Secondary | ICD-10-CM

## 2016-09-24 NOTE — Progress Notes (Signed)
Appointment cancelled

## 2016-10-01 ENCOUNTER — Encounter: Payer: Self-pay | Admitting: Obstetrics and Gynecology

## 2016-10-01 ENCOUNTER — Ambulatory Visit (INDEPENDENT_AMBULATORY_CARE_PROVIDER_SITE_OTHER): Payer: Medicaid Other | Admitting: Obstetrics and Gynecology

## 2016-10-01 VITALS — BP 146/99 | HR 82 | Temp 98.0°F | Ht 63.0 in | Wt 176.6 lb

## 2016-10-01 DIAGNOSIS — D251 Intramural leiomyoma of uterus: Secondary | ICD-10-CM

## 2016-10-01 DIAGNOSIS — I10 Essential (primary) hypertension: Secondary | ICD-10-CM | POA: Diagnosis not present

## 2016-10-01 DIAGNOSIS — E039 Hypothyroidism, unspecified: Secondary | ICD-10-CM | POA: Insufficient documentation

## 2016-10-01 DIAGNOSIS — N946 Dysmenorrhea, unspecified: Secondary | ICD-10-CM | POA: Diagnosis not present

## 2016-10-01 NOTE — Patient Instructions (Signed)
Hysterectomy Information   A hysterectomy is a surgery in which your uterus is removed. This surgery may be done to treat various medical problems. After the surgery, you will no longer have menstrual periods. The surgery will also make you unable to become pregnant (sterile). The fallopian tubes and ovaries can be removed (bilateral salpingo-oophorectomy) during this surgery as well.   REASONS FOR A HYSTERECTOMY  · Persistent, abnormal bleeding.  · Lasting (chronic) pelvic pain or infection.  · The lining of the uterus (endometrium) starts growing outside the uterus (endometriosis).  · The endometrium starts growing in the muscle of the uterus (adenomyosis).  · The uterus falls down into the vagina (pelvic organ prolapse).  · Noncancerous growths in the uterus (uterine fibroids) that cause symptoms.  · Precancerous cells.  · Cervical cancer or uterine cancer.  TYPES OF HYSTERECTOMIES  · Supracervical hysterectomy--In this type, the top part of the uterus is removed, but not the cervix.  · Total hysterectomy--The uterus and cervix are removed.  · Radical hysterectomy--The uterus, the cervix, and the fibrous tissue that holds the uterus in place in the pelvis (parametrium) are removed.  WAYS A HYSTERECTOMY CAN BE PERFORMED  · Abdominal hysterectomy--A large surgical cut (incision) is made in the abdomen. The uterus is removed through this incision.  · Vaginal hysterectomy--An incision is made in the vagina. The uterus is removed through this incision. There are no abdominal incisions.  · Conventional laparoscopic hysterectomy--Three or four small incisions are made in the abdomen. A thin, lighted tube with a camera (laparoscope) is inserted into one of the incisions. Other tools are put through the other incisions. The uterus is cut into small pieces. The small pieces are removed through the incisions, or they are removed through the vagina.  · Laparoscopically assisted vaginal hysterectomy (LAVH)--Three or four  small incisions are made in the abdomen. Part of the surgery is performed laparoscopically and part vaginally. The uterus is removed through the vagina.  · Robot-assisted laparoscopic hysterectomy--A laparoscope and other tools are inserted into 3 or 4 small incisions in the abdomen. A computer-controlled device is used to give the surgeon a 3D image and to help control the surgical instruments. This allows for more precise movements of surgical instruments. The uterus is cut into small pieces and removed through the incisions or removed through the vagina.  RISKS AND COMPLICATIONS   Possible complications associated with this procedure include:  · Bleeding and risk of blood transfusion. Tell your health care provider if you do not want to receive any blood products.  · Blood clots in the legs or lung.  · Infection.  · Injury to surrounding organs.  · Problems or side effects related to anesthesia.  · Conversion to an abdominal hysterectomy from one of the other techniques.  WHAT TO EXPECT AFTER A HYSTERECTOMY  · You will be given pain medicine.  · You will need to have someone with you for the first 3-5 days after you go home.  · You will need to follow up with your surgeon in 2-4 weeks after surgery to evaluate your progress.  · You may have early menopause symptoms such as hot flashes, night sweats, and insomnia.  · If you had a hysterectomy for a problem that was not cancer or not a condition that could lead to cancer, then you no longer need Pap tests. However, even if you no longer need a Pap test, a regular exam is a good idea to make sure no   other problems are starting.     This information is not intended to replace advice given to you by your health care provider. Make sure you discuss any questions you have with your health care provider.     Document Released: 05/29/2001 Document Revised: 09/23/2013 Document Reviewed: 08/10/2013  Elsevier Interactive Patient Education ©2016 Elsevier Inc.

## 2016-10-01 NOTE — Progress Notes (Signed)
Patient is in the office to discuss u/s results. Patient stated that she is scheduled to see primary provider to have BP meds adjusted due to increased BP.

## 2016-10-01 NOTE — Progress Notes (Signed)
Subjective:     Kendra Zimmerman is a 45 y.o. female here for discussion of her U/S performed on 09/20/16 d/t dysmenorrhea. Pt has a long standing H/O dysmenorrhea secondary to uterine fibroids. She is s/p BTL, Novasure ablation and abd Myomectomy this past Feb. Five fibroids were removed at that time.  She continues to have dysmenorrhea. U/S reveal at least 6 fibroids with largest @ 3 cm. Uterine vol of 358.5.  She has had TSVD x 6 with the largest being 9#.    Gynecologic History Patient's last menstrual period was 09/15/2016. Contraception: tubal ligation Obstetric History OB History  Gravida Para Term Preterm AB Living  9 6 6   3 6   SAB TAB Ectopic Multiple Live Births  0 3          # Outcome Date GA Lbr Len/2nd Weight Sex Delivery Anes PTL Lv  9 TAB           8 TAB           7 TAB           6 Term           5 Term           4 Term           3 Term           2 Term           1 Term                The following portions of the patient's history were reviewed and updated as appropriate: allergies, current medications, past family history, past medical history, past social history and past surgical history.  Review of Systems Pertinent items are noted in HPI.    Objective:    AF BP noted Lungs clear  Heart RRR Abd soft + BS Pelvic exam uterus < 12 weeks size mobile, non tender, no adnexal masses    Assessment:  Dysmenorrhea HTN   Plan:    U/S reviewed with pt. Treatment options reviewed. Pt desires define therapy. TVH/BS recommended and reviewed with pt. Information provided to pt. Pt to schedule appt with PCP for BP eval and pre op clearance. Once this is completed will schedule TVH/BS

## 2016-10-26 ENCOUNTER — Ambulatory Visit
Admission: RE | Admit: 2016-10-26 | Discharge: 2016-10-26 | Disposition: A | Discharge status: Home / Self Care | Payer: Medicaid Other | Source: Ambulatory Visit | Attending: Obstetrics & Gynecology | Admitting: Obstetrics & Gynecology

## 2016-10-26 DIAGNOSIS — Z1231 Encounter for screening mammogram for malignant neoplasm of breast: Secondary | ICD-10-CM

## 2016-11-10 IMAGING — US US SOFT TISSUE HEAD/NECK
1 series · 14 of 25 positions shown · non-contrast
Comparison: 07/09/2013

CLINICAL DATA: Grave's disease

EXAM:
THYROID ULTRASOUND
TECHNIQUE: Ultrasound examination of the thyroid gland and adjacent soft
tissues was performed.

[Series 1: us soft tissue head/neck · 0.08mm/px · 14 of 51 slices shown]
[im 1/51]
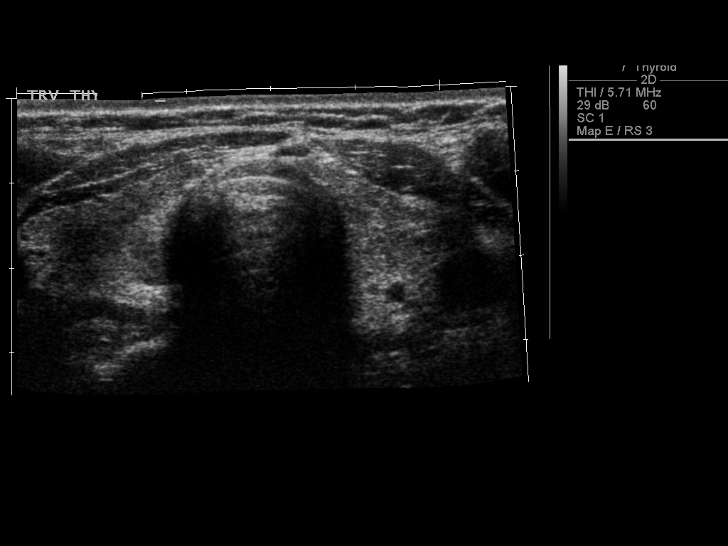
[im 5/51]
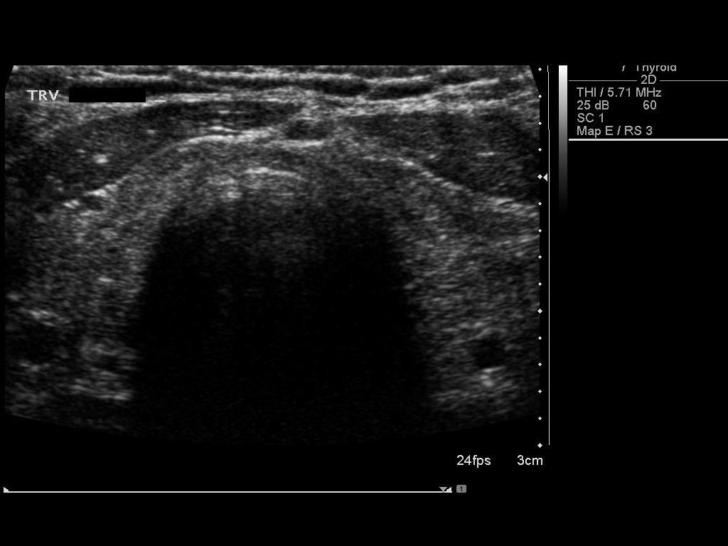
[im 9/51]
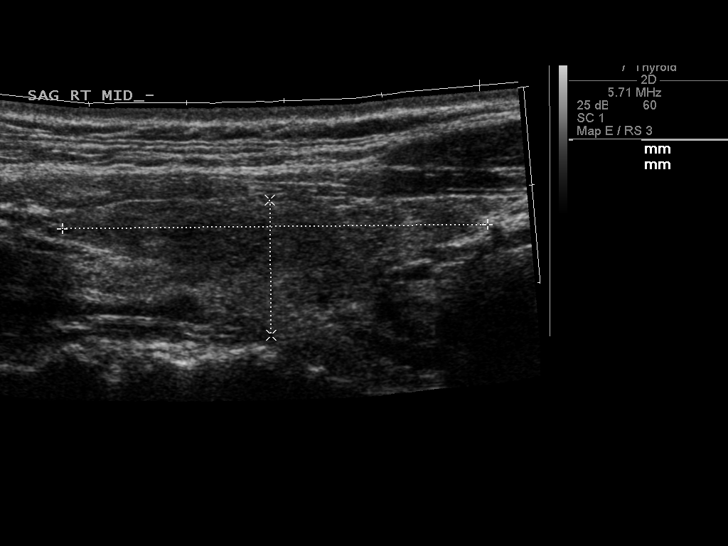
[im 13/51]
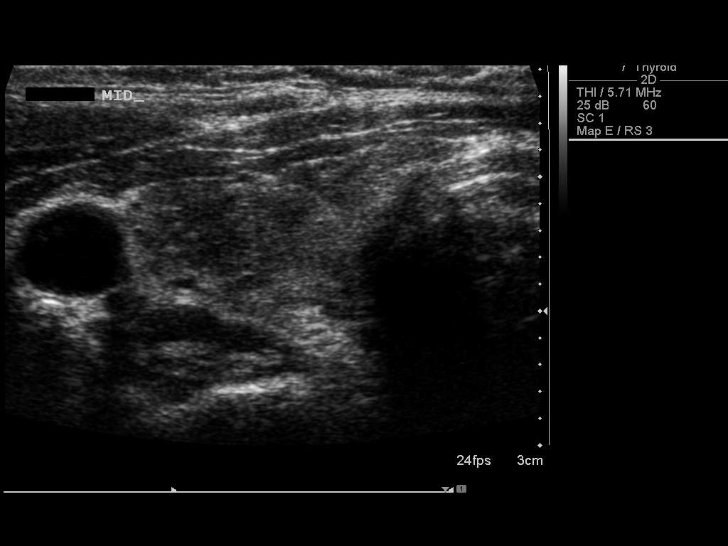
[im 17/51]
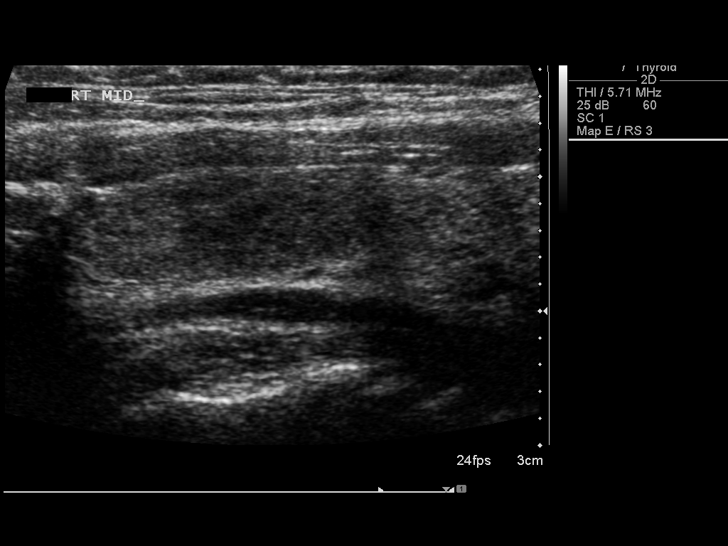
[im 19/51]
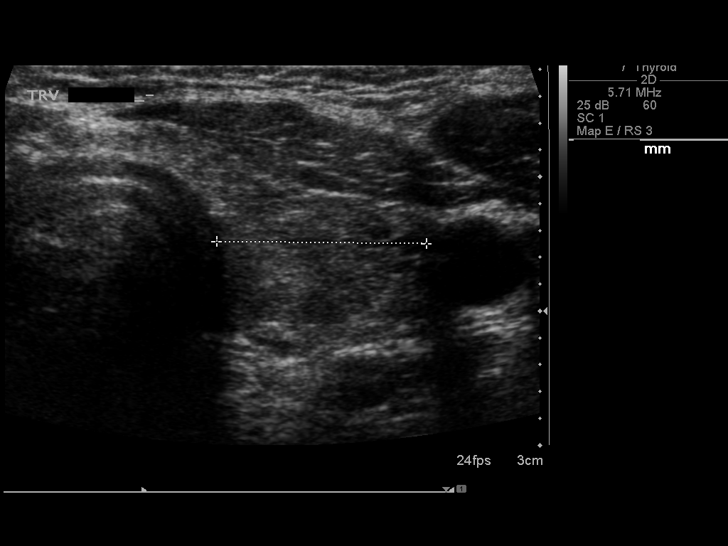
[im 23/51]
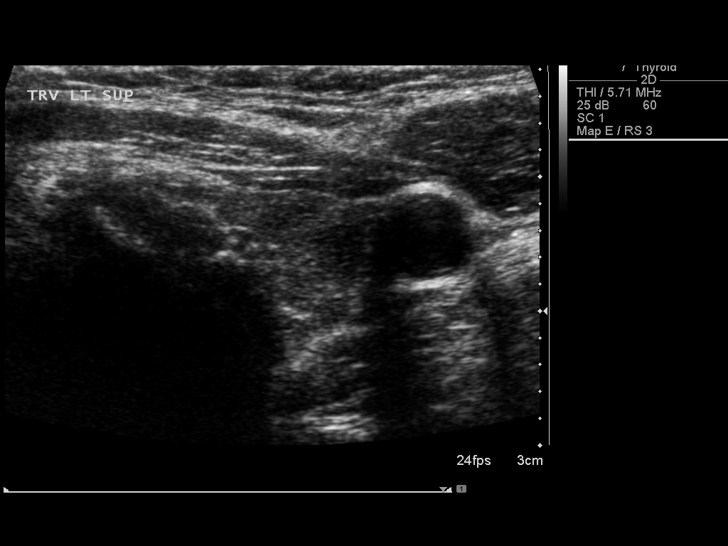
[im 28/51]
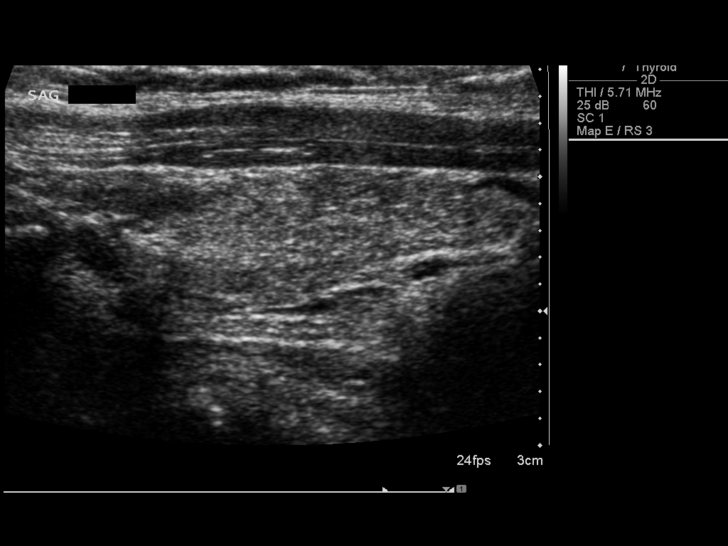
[im 32/51]
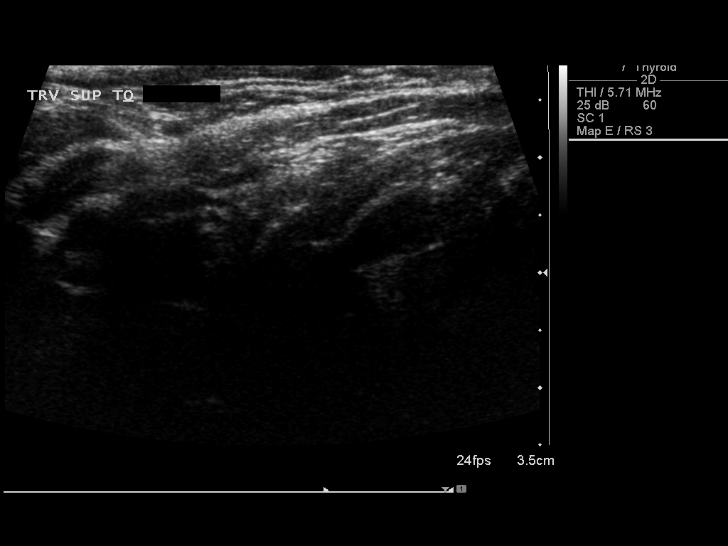
[im 34/51]
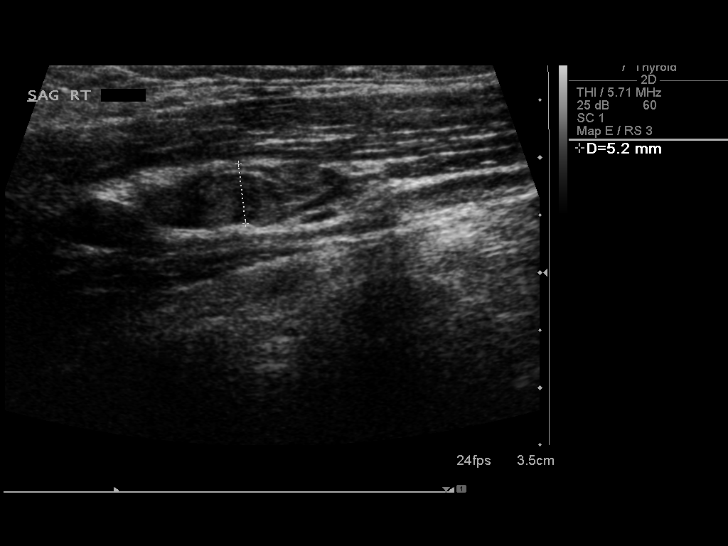
[im 38/51]
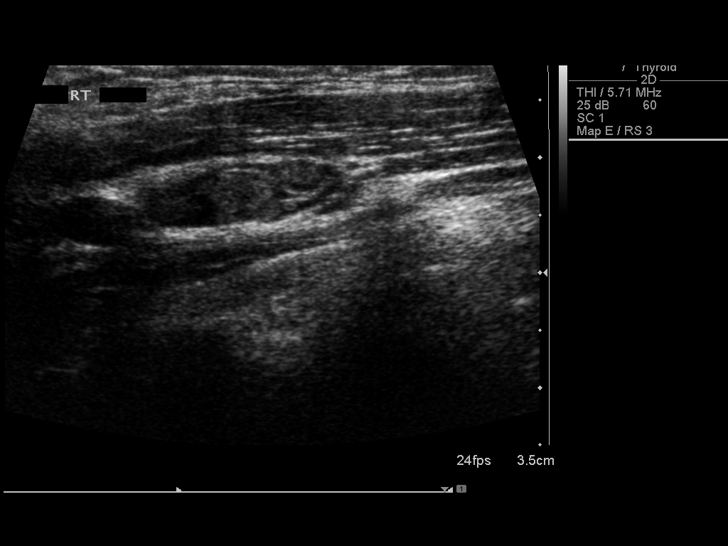
[im 42/51]
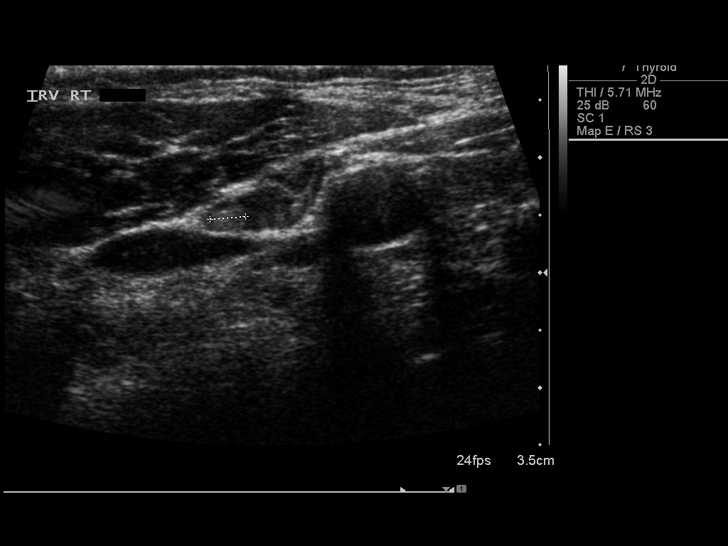
[im 46/51]
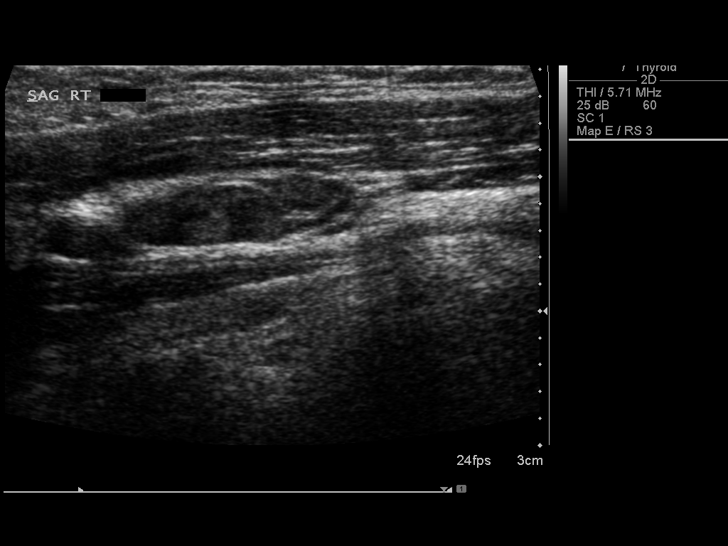
[im 51/51]
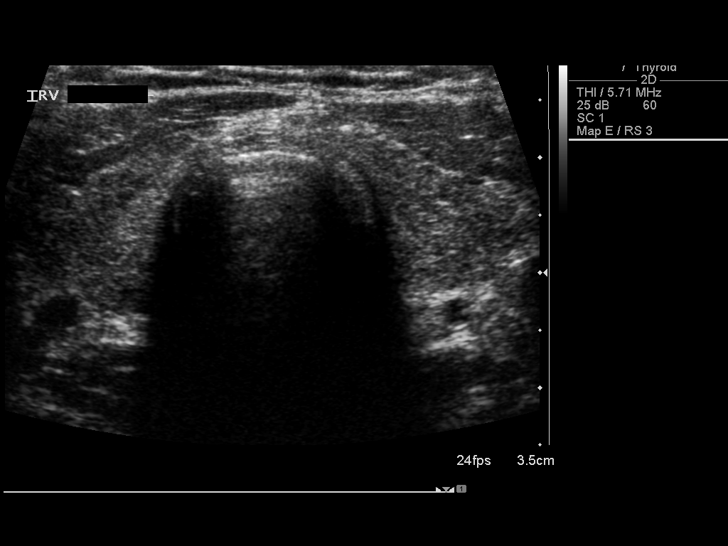

[14 of 25 positions shown; findings below may reference images not displayed]

FINDINGS: Right thyroid lobe

Measurements: 4.3 x 1.4 x 1.6 cm. Heterogeneous without focal
nodule.

Left thyroid lobe

Measurements: 4.2 x 1.0 x 1.6 cm. Heterogeneous without focal
nodule.

Isthmus

Thickness: 2 mm. Hypoechoic 5 mm solid nodule. Hypoechoic 3 mm
adjacent nodule.

Lymphadenopathy

None visualized.
IMPRESSION: Small isthmic nodules. Heterogeneous glandular tissue. Findings do
not meet current SRU consensus criteria for biopsy. Follow-up by
clinical exam is recommended. If patient has known risk factors for
thyroid carcinoma, consider follow-up ultrasound in 12 months. If
patient is clinically hyperthyroid, consider nuclear medicine
thyroid uptake and scan.Reference: Management of Thyroid Nodules
Detected at US: Society of Radiologists in Ultrasound Consensus

## 2016-11-26 ENCOUNTER — Ambulatory Visit (INDEPENDENT_AMBULATORY_CARE_PROVIDER_SITE_OTHER): Payer: Medicaid Other | Admitting: Obstetrics

## 2016-11-26 ENCOUNTER — Encounter: Payer: Self-pay | Admitting: Obstetrics

## 2016-11-26 VITALS — BP 159/104 | HR 87 | Temp 98.7°F | Wt 176.3 lb

## 2016-11-26 DIAGNOSIS — I10 Essential (primary) hypertension: Secondary | ICD-10-CM

## 2016-11-26 DIAGNOSIS — B373 Candidiasis of vulva and vagina: Secondary | ICD-10-CM

## 2016-11-26 DIAGNOSIS — B3731 Acute candidiasis of vulva and vagina: Secondary | ICD-10-CM

## 2016-11-26 MED ORDER — FLUCONAZOLE 150 MG PO TABS: 150.0000 mg | ORAL_TABLET | Freq: Once | ORAL | 2 refills | Status: AC

## 2016-11-26 NOTE — Progress Notes (Signed)
Patient ID: Kendra Zimmerman, female   DOB: 30-May-1971, 45 y.o.   MRN: [id number]  Chief Complaint  Patient presents with  . other    Yeast infection    HPI Kendra Zimmerman is a 45 y.o. female.  Vaginal discharge and itching.  Denies vaginal odor.  Thinks that she may have a yeast infection. HPI  Past Medical History:  Diagnosis Date  . Alcohol abuse    sober 20 months  . Anxiety   . Chlamydia   . Hypertension   . Hyperthyroidism   . Syphilis   . Trichomonas     Past Surgical History:  Procedure Laterality Date  . INDUCED ABORTION    . MYOMECTOMY N/A 02/08/2016   Procedure: ABDOMINAL MYOMECTOMY;  Surgeon: Frederico Hamman, MD;  Location: [facility];  Service: Gynecology;  Laterality: N/A;  . NOVASURE ABLATION     2010  . TUBAL LIGATION      Family History  Problem Relation Age of Onset  . Alzheimer's disease Paternal Grandmother   . Hypertension Maternal Grandmother   . Alcohol abuse Maternal Grandmother   . Anesthesia problems Neg Hx   . Hypotension Neg Hx   . Malignant hyperthermia Neg Hx   . Pseudochol deficiency Neg Hx     Social History Social History  Substance Use Topics  . Smoking status: Never Smoker  . Smokeless tobacco: Never Used  . Alcohol use No     Comment: hx alcohol use but has stopped    No Known Allergies  Current Outpatient Prescriptions  Medication Sig Dispense Refill  . levothyroxine (SYNTHROID, LEVOTHROID) 25 MCG tablet Take 25 mcg by mouth daily before breakfast.    . lisinopril (PRINIVIL,ZESTRIL) 20 MG tablet Take 20 mg by mouth daily.    . fluconazole (DIFLUCAN) 150 MG tablet Take 1 tablet (150 mg total) by mouth once. 1 tablet 2   No current facility-administered medications for this visit.     Review of Systems Review of Systems Constitutional: negative for fatigue and weight loss Respiratory: negative for cough and wheezing Cardiovascular: negative for chest pain, fatigue and palpitations Gastrointestinal: negative for abdominal  pain and change in bowel habits Genitourinary:positive for vaginal discharge and itching Integument/breast: negative for nipple discharge Musculoskeletal:negative for myalgias Neurological: negative for gait problems and tremors Behavioral/Psych: negative for abusive relationship, depression Endocrine: negative for temperature intolerance      Blood pressure (!) 159/104, pulse 87, temperature 98.7 F (37.1 C), temperature source Oral, weight 176 lb 4.8 oz (80 kg), last menstrual period 11/08/2016.  Physical Exam Physical Exam            General:  Alert and no distress Abdomen:  normal findings: no organomegaly, soft, non-tender and no hernia  Pelvis:  External genitalia: normal general appearance Urinary system: urethral meatus normal and bladder without fullness, nontender Vaginal: normal without tenderness, induration or masses Cervix: normal appearance Adnexa: normal bimanual exam Uterus: anteverted and non-tender, normal size    50% of 15 min visit spent on counseling and coordination of care.    Data Reviewed Wet prep  Assessment     Candida Vaginitis Hypertension    Plan    Diflucan Rx Patient to call PCP for BP management F/U prn  No orders of the defined types were placed in this encounter.  Meds ordered this encounter  Medications  . fluconazole (DIFLUCAN) 150 MG tablet    Sig: Take 1 tablet (150 mg total) by mouth once.    Dispense:  1  tablet    Refill:  2

## 2016-12-06 LAB — NUSWAB BV AND CANDIDA, NAA
CANDIDA ALBICANS, NAA: POSITIVE — AB
CANDIDA GLABRATA, NAA: NEGATIVE

## 2016-12-12 ENCOUNTER — Other Ambulatory Visit: Payer: Self-pay | Admitting: Obstetrics

## 2016-12-12 DIAGNOSIS — B3731 Acute candidiasis of vulva and vagina: Secondary | ICD-10-CM

## 2016-12-12 DIAGNOSIS — B373 Candidiasis of vulva and vagina: Secondary | ICD-10-CM

## 2016-12-12 MED ORDER — TERCONAZOLE 0.8 % VA CREA: 1.0000 | TOPICAL_CREAM | Freq: Every day | VAGINAL | 0 refills | Status: DC

## 2017-01-27 ENCOUNTER — Emergency Department (HOSPITAL_COMMUNITY)
Admission: EM | Admit: 2017-01-27 | Discharge: 2017-01-27 | Disposition: A | Discharge status: Home / Self Care | Payer: Medicaid Other | Attending: Emergency Medicine | Admitting: Emergency Medicine

## 2017-01-27 ENCOUNTER — Encounter (HOSPITAL_COMMUNITY): Payer: Self-pay | Admitting: Emergency Medicine

## 2017-01-27 DIAGNOSIS — Z79899 Other long term (current) drug therapy: Secondary | ICD-10-CM | POA: Diagnosis not present

## 2017-01-27 DIAGNOSIS — H9312 Tinnitus, left ear: Secondary | ICD-10-CM | POA: Diagnosis present

## 2017-01-27 DIAGNOSIS — H7292 Unspecified perforation of tympanic membrane, left ear: Secondary | ICD-10-CM

## 2017-01-27 DIAGNOSIS — I10 Essential (primary) hypertension: Secondary | ICD-10-CM | POA: Diagnosis not present

## 2017-01-27 DIAGNOSIS — E039 Hypothyroidism, unspecified: Secondary | ICD-10-CM | POA: Insufficient documentation

## 2017-01-27 MED ORDER — OFLOXACIN 0.3 % OT SOLN: 5.0000 [drp] | Freq: Two times a day (BID) | OTIC | 0 refills | Status: DC

## 2017-01-27 NOTE — ED Triage Notes (Signed)
Patient reports not being able to hear out of left ear since yesterday. Denies pain.

## 2017-01-27 NOTE — ED Notes (Signed)
Bed: WTR8 Expected date:  Expected time:  Means of arrival:  Comments: 

## 2017-01-27 NOTE — Discharge Instructions (Signed)
Please read attached information. If you experience any new or worsening signs or symptoms please return to the emergency room for evaluation. Please follow-up with your primary care provider or specialist as discussed. Please use medication prescribed only as directed and discontinue taking if you have any concerning signs or symptoms.   °
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
## 2017-02-06 IMAGING — US US PELVIS COMPLETE
1 series · 13 of 25 positions shown · non-contrast
Comparison: 05/30/2011.

CLINICAL DATA: Subsequent encounter for uterine fibroids.

EXAM:
TRANSABDOMINAL AND TRANSVAGINAL ULTRASOUND OF PELVIS
TECHNIQUE: Both transabdominal and transvaginal ultrasound examinations of the
pelvis were performed. Transabdominal technique was performed for
global imaging of the pelvis including uterus, ovaries, adnexal
regions, and pelvic cul-de-sac. It was necessary to proceed with
endovaginal exam following the transabdominal exam to visualize the
endometrial tissue.

[Series 1: us transvaginal non-ob · 65 acquisitions, 13 frames shown]
[im 1/65]
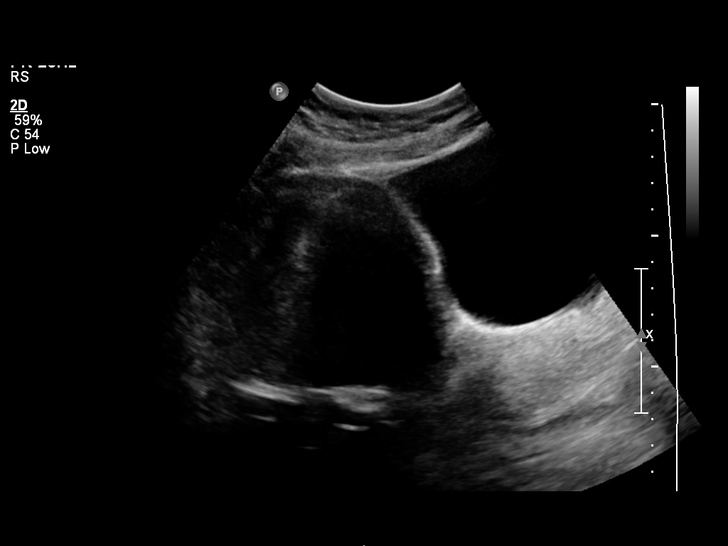
[im 6/65]
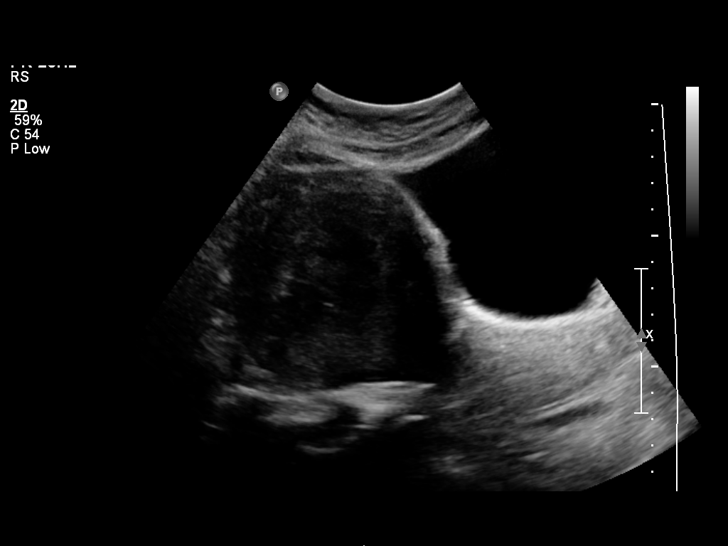
[im 11/65]
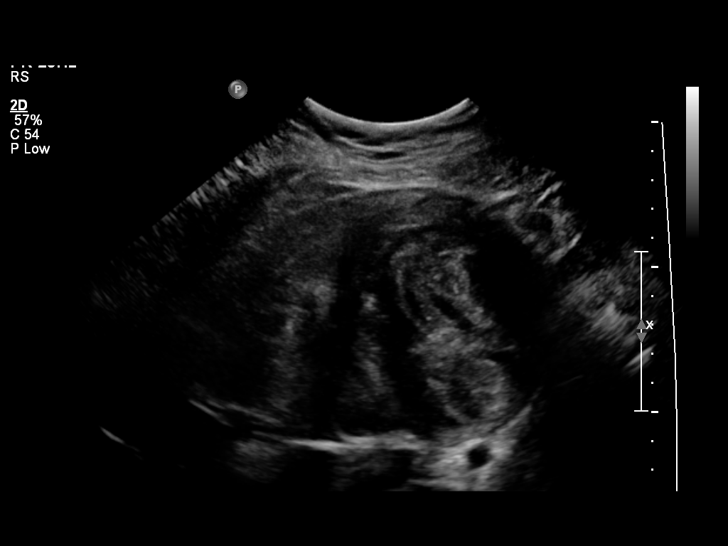
[im 17/65]
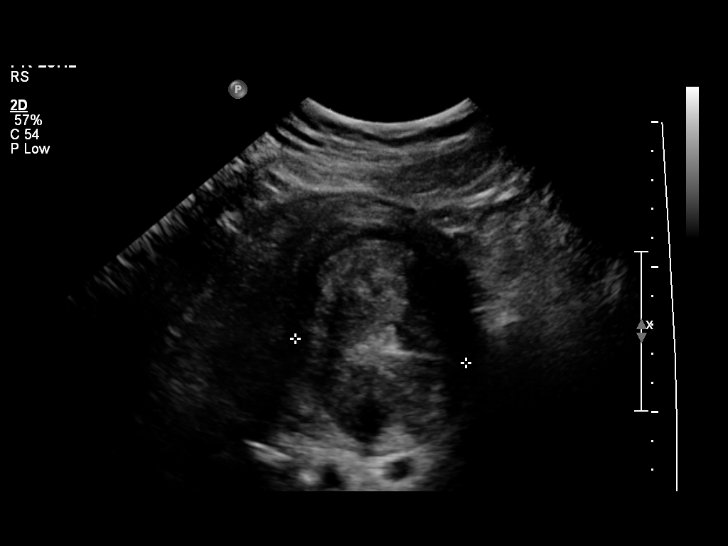
[im 22/65]
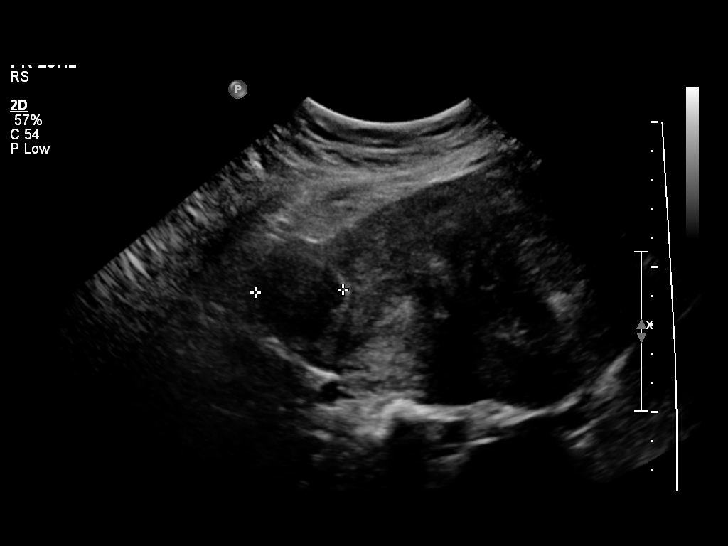
[im 27/65]
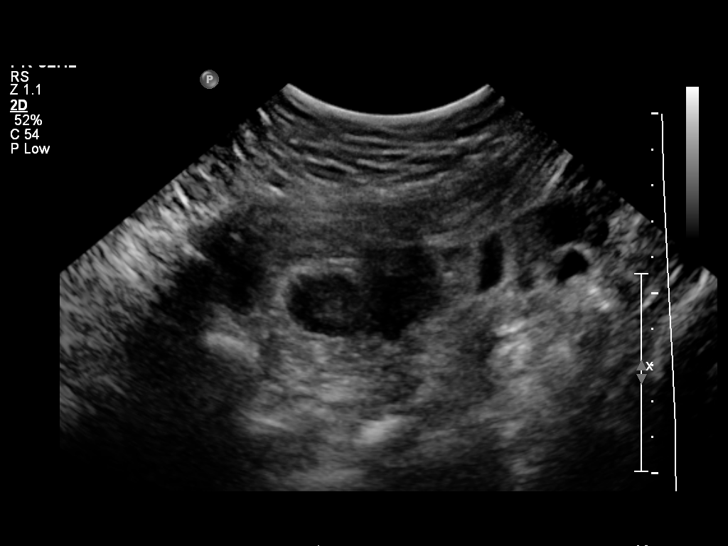
[im 33/65]
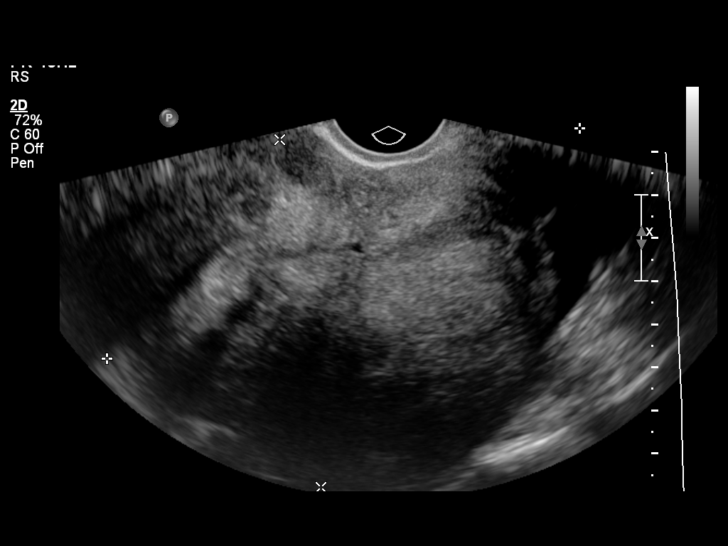
[im 38/65]
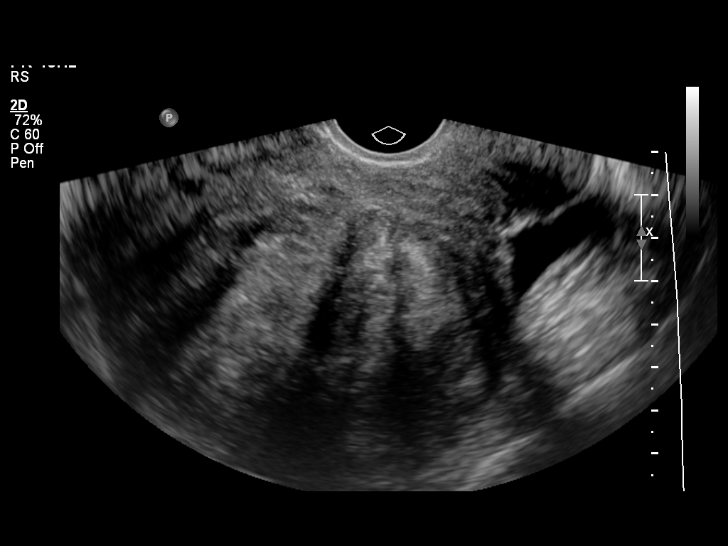
[im 43/65]
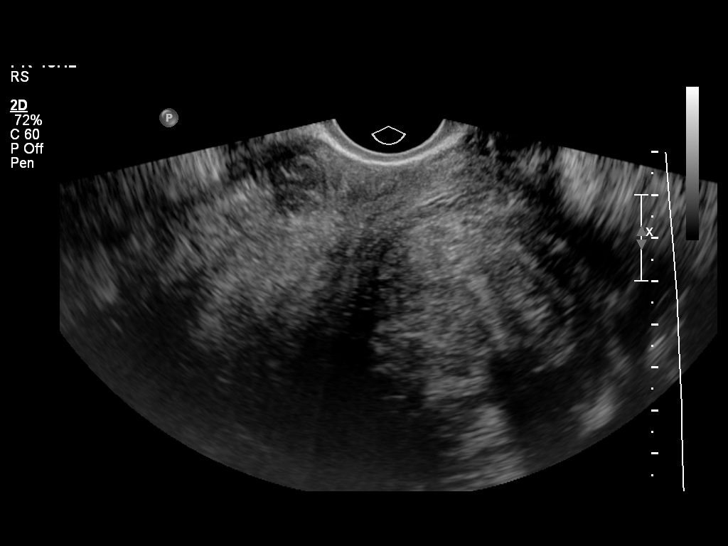
[im 49/65]
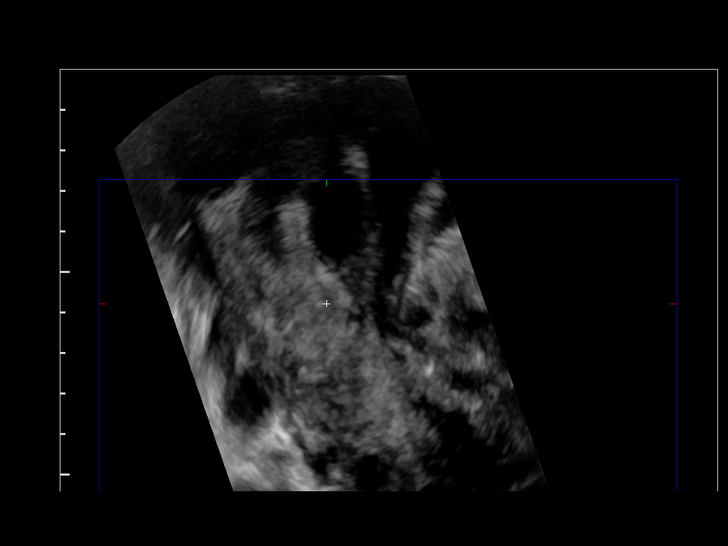
[im 54/65]
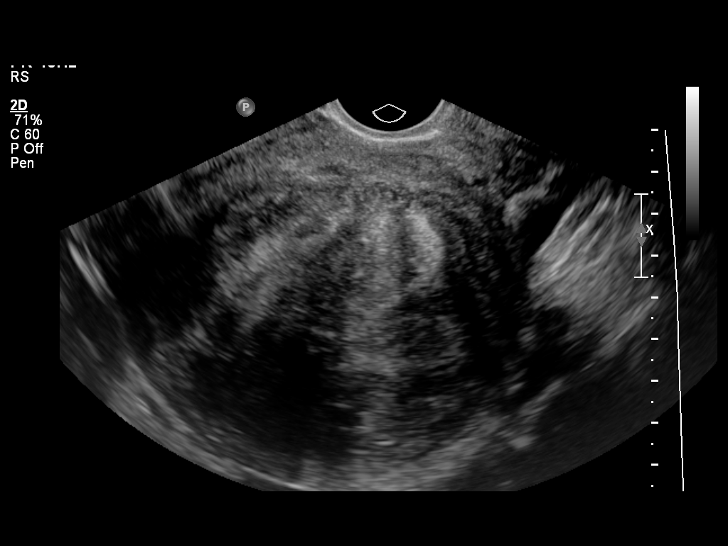
[im 59/65]
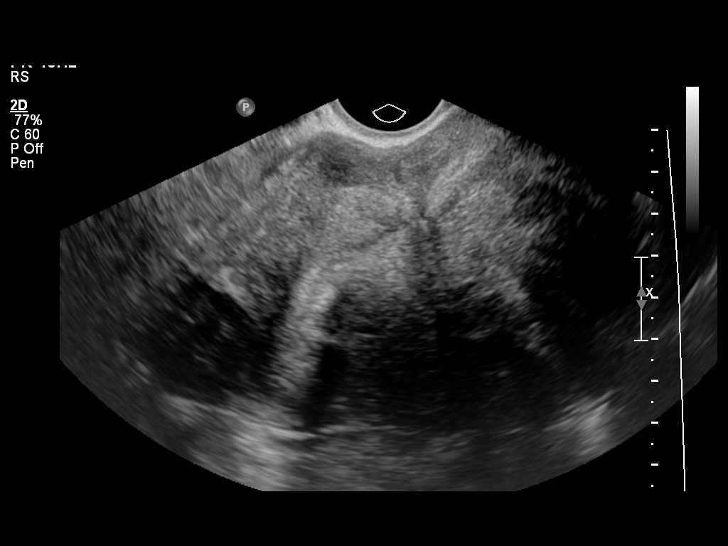
[im 65/65]
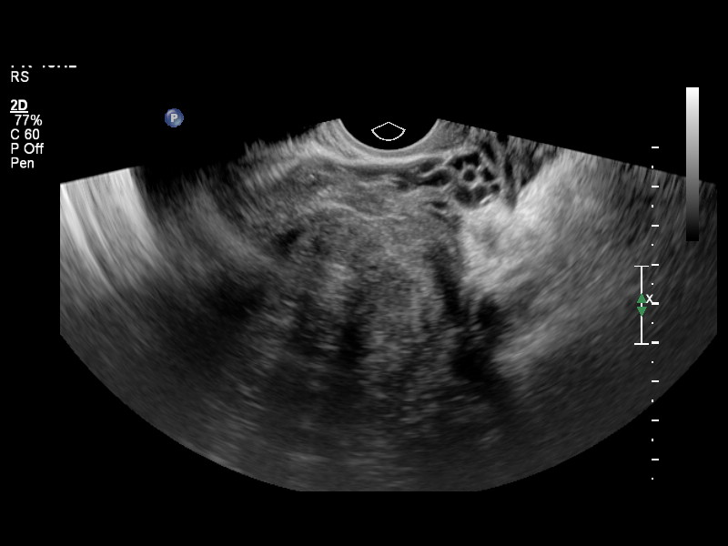

[13 of 25 positions shown; findings below may reference images not displayed]

FINDINGS: Uterus

Measurements: 14.3 x 8.4 x 12.6 cm. Multiple uterine fibroids again
identified. 7.8 x 6.9 x 7.1 cm apparent fibroid is identified in the
central uterus. This is new in the interval in would represent
fairly substantial interval growth. The lesion largely obscures the
endometrial cavity. Other smaller subserosal fibroids are seen along
the uterine fundus, measuring up to about 3 cm in diameter.

Endometrium

Thickness: Obscured by fibroids on transabdominal and transvaginal
scanning..

Right ovary

Measurements: 3.3 x 2.8 x 2.8 cm. Normal appearance/no adnexal mass.

Left ovary

Measurements: 2.4 x 1.6 x 2.2 cm. Normal appearance/no adnexal mass.

Other findings

Trace amount of simple appearing free fluid is seen in the
cul-de-sac.
IMPRESSION: Apparent interval development of an almost 8 cm central fibroid in
the mid uterus. This would represent relatively rapid interval
growth in this lesion that largely obscures the endometrial stripe.
MRI of the pelvis without and with contrast is recommended to
further evaluate and ensure that the new lesion is not of
endometrial origin.

These results will be called to the ordering clinician or
representative by the Radiologist Assistant, and communication
documented in the PACS or zVision Dashboard.

## 2017-02-07 ENCOUNTER — Encounter: Payer: Self-pay | Admitting: *Deleted

## 2017-02-25 ENCOUNTER — Ambulatory Visit: Payer: Medicaid Other | Admitting: Obstetrics and Gynecology

## 2017-03-13 ENCOUNTER — Encounter: Payer: Self-pay | Admitting: Obstetrics and Gynecology

## 2017-03-13 ENCOUNTER — Ambulatory Visit (INDEPENDENT_AMBULATORY_CARE_PROVIDER_SITE_OTHER): Payer: Medicaid Other | Admitting: Obstetrics and Gynecology

## 2017-03-13 VITALS — BP 114/99 | HR 89 | Wt 183.9 lb

## 2017-03-13 DIAGNOSIS — N946 Dysmenorrhea, unspecified: Secondary | ICD-10-CM

## 2017-03-13 DIAGNOSIS — D251 Intramural leiomyoma of uterus: Secondary | ICD-10-CM | POA: Diagnosis not present

## 2017-03-13 DIAGNOSIS — D252 Subserosal leiomyoma of uterus: Secondary | ICD-10-CM

## 2017-03-13 NOTE — Progress Notes (Signed)
Ms Nolon Bussing presents for follow up and schedule her surgery. Please see prior notes. She has received pre op clearance form her PCP.   She continues to have dysmenorrhea with her cycles. Her history has previously been documented  She desire definite therapy in the form of a TVH/BS  U/S of 09/20/16 revealed multiple fibroids and an ovarian cyst.   POB TSVD x 6 EAB x 2  PE AF VSS Lungs clear Heart RRR Abd soft + BS GU Nl EGBUS, uterus 10 -12 week size, mobile, slightly tender, no adnexal masses  A/P Dysmenorrhea         Uterine fibroids  Will order GYN U/S to follow up on ovarian cyst. Schedule pt for TVH/BS Risk/bebeifts and post op care reviewed with pt. Pt made aware that unable to garunate that surgery will eliminate her pain but hopefully it will improve. Pt verbalized understand Will schedule

## 2017-03-13 NOTE — Patient Instructions (Signed)
Vaginal Hysterectomy A vaginal hysterectomy is a procedure to remove all or part of the uterus through a small incision in the vagina. In this procedure, your health care provider may remove your entire uterus, including the lower end (cervix). You may need a vaginal hysterectomy to treat:  Uterine fibroids.  A condition that causes the lining of the uterus to grow in other areas (endometriosis).  Problems with pelvic support.  Cancer of the cervix, ovaries, uterus, or tissue that lines the uterus (endometrium).  Excessive (dysfunctional) uterine bleeding. When removing your uterus, your health care provider may also remove the organs that produce eggs (ovaries) and the tubes that carry eggs to your uterus (fallopian tubes). After a vaginal hysterectomy, you will no longer be able to have a baby. You will also no longer get your menstrual period. Tell a health care provider about:  Any allergies you have.  All medicines you are taking, including vitamins, herbs, eye drops, creams, and over-the-counter medicines.  Any problems you or family members have had with anesthetic medicines.  Any blood disorders you have.  Any surgeries you have had.  Any medical conditions you have.  Whether you are pregnant or may be pregnant. What are the risks? Generally, this is a safe procedure. However, problems may occur, including:  Bleeding.  Infection.  A blood clot that forms in your leg and travels to your lungs (pulmonary embolism).  Damage to surrounding organs.  Pain during sex. What happens before the procedure?  Ask your health care provider what organs will be removed during surgery.  Ask your health care provider about:  Changing or stopping your regular medicines. This is especially important if you are taking diabetes medicines or blood thinners.  Taking medicines such as aspirin and ibuprofen. These medicines can thin your blood. Do not take these medicines before your  procedure if your health care provider instructs you not to.  Follow instructions from your health care provider about eating or drinking restrictions.  Do not use any tobacco products, such as cigarettes, chewing tobacco, and e-cigarettes. If you need help quitting, ask your health care provider.  Plan to have someone take you home after discharge from the hospital. What happens during the procedure?  To reduce your risk of infection:  Your health care team will wash or sanitize their hands.  Your skin will be washed with soap.  An IV tube will be inserted into one of your veins.  You may be given antibiotic medicine to help prevent infection.  You will be given one or more of the following:  A medicine to help you relax (sedative).  A medicine to numb the area (local anesthetic).  A medicine to make you fall asleep (general anesthetic).  A medicine that is injected into an area of your body to numb everything beyond the injection site (regional anesthetic).  Your surgeon will make an incision in your vagina.  Your surgeon will locate and remove all or part of your uterus.  Your ovaries and fallopian tubes may be removed at the same time.  The incision will be closed with stitches (sutures) that dissolve over time. The procedure may vary among health care providers and hospitals. What happens after the procedure?  Your blood pressure, heart rate, breathing rate, and blood oxygen level will be monitored often until the medicines you were given have worn off.  You will be encouraged to get up and walk around after a few hours to help prevent complications.  You may have IV tubes in place for a few days.  You will be given pain medicine as needed.  Do not drive for 24 hours if you were given a sedative. This information is not intended to replace advice given to you by your health care provider. Make sure you discuss any questions you have with your health care  provider. Document Released: 03/26/2016 Document Revised: 05/10/2016 Document Reviewed: 12/18/2015 Elsevier Interactive Patient Education  2017 [employer].

## 2017-03-14 ENCOUNTER — Encounter (HOSPITAL_COMMUNITY): Payer: Self-pay

## 2017-03-20 IMAGING — MR MR PELVIS WO/W CM
5 of 10 series · 19 of 48 positions shown · IV contrast (multihance)
Comparison: Pelvic ultrasound 03/04/2015 and 05/30/2011. Pelvic CT
07/16/2010.

CLINICAL DATA: Uterine fibroids with interval development of a
probable central fibroid on recent ultrasound. Subsequent encounter.

EXAM:
MRI PELVIS WITHOUT AND WITH CONTRAST
TECHNIQUE: Multiplanar multisequence MR imaging of the pelvis was performed
both before and after administration of intravenous contrast.
CONTRAST:  14 ml MultiHance.

[Series 3: T2 · coronal · 6.0mm · 0.78mm/px · 3 of 27 slices shown (1 of 4)]
[im 1/27]
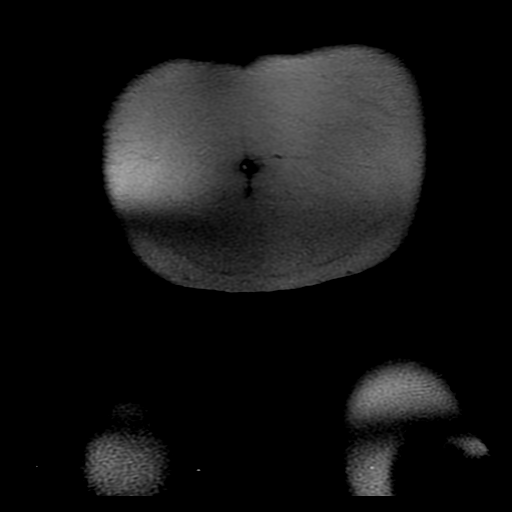
[im 14/27]
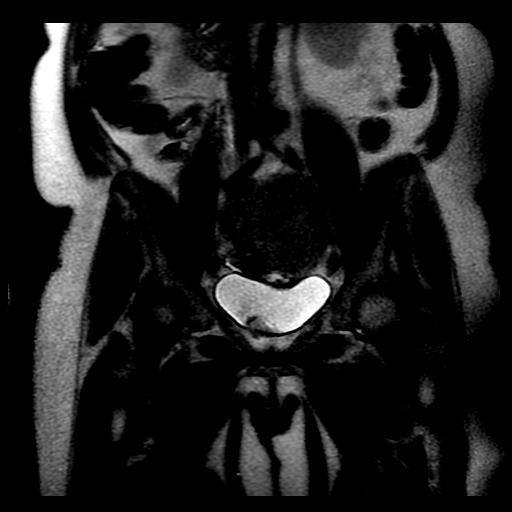
[im 27/27]
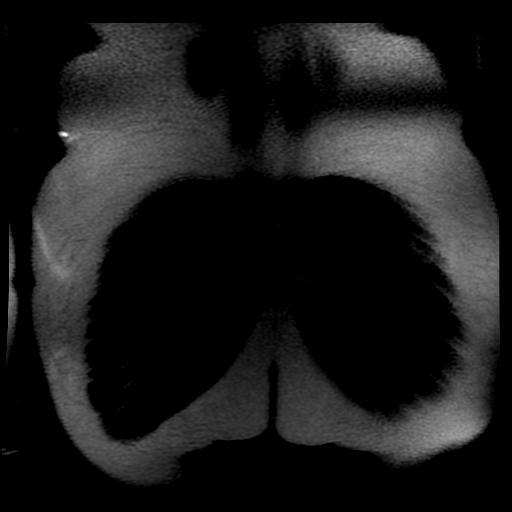

[Series 4: T2 · coronal · 5.0mm · 0.47mm/px · 5 of 29 slices shown (2 of 4)]
[im 1/29]
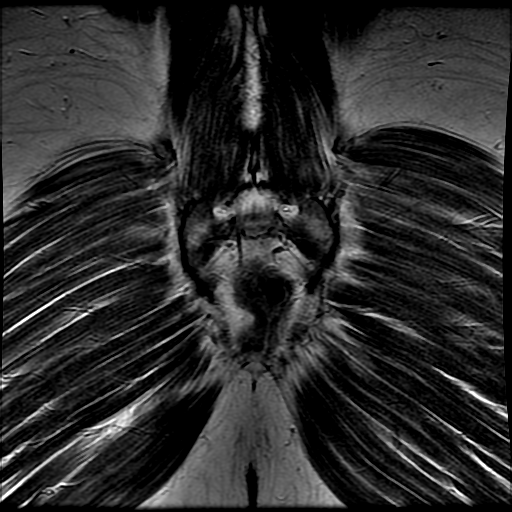
[im 8/29]
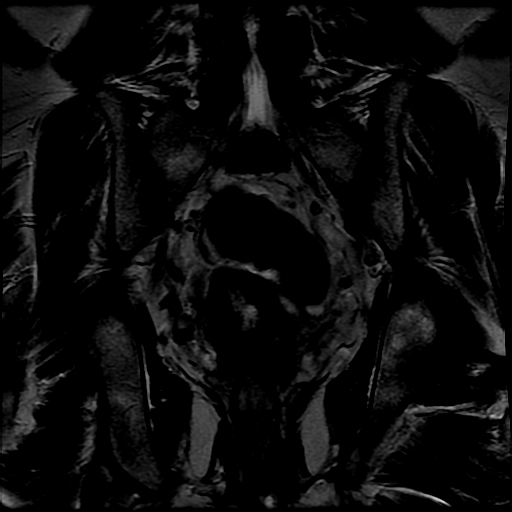
[im 15/29]
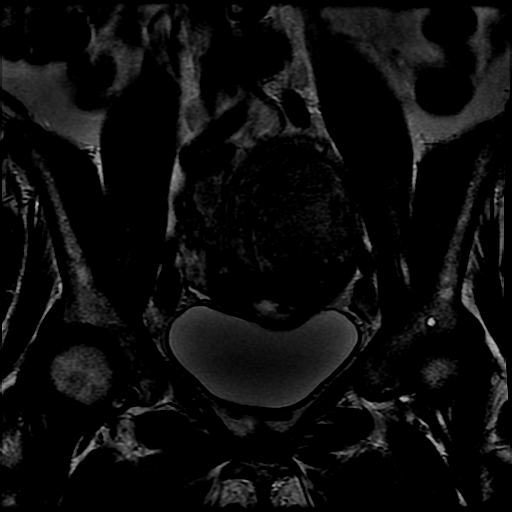
[im 22/29]
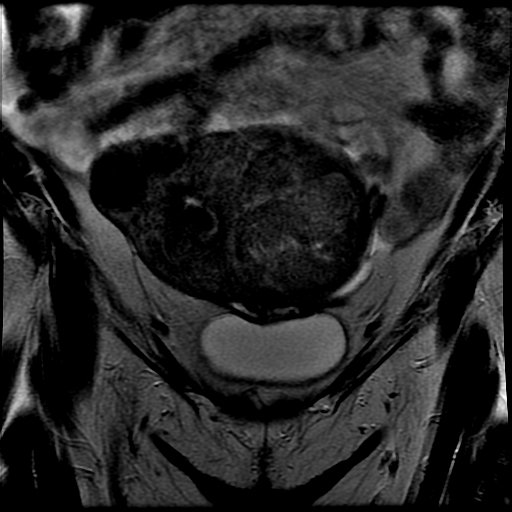
[im 29/29]
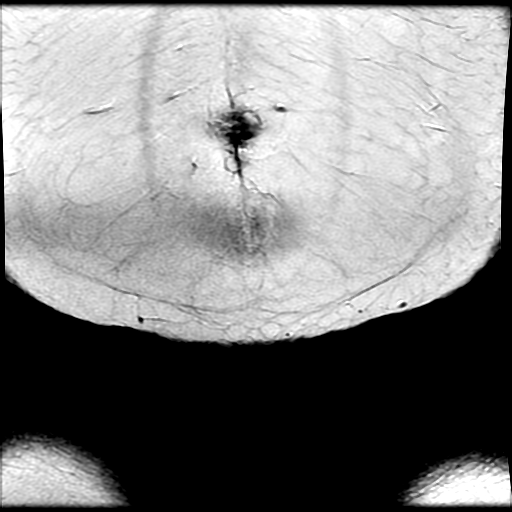

[Series 5: T2 · axial · 5.0mm · 0.47mm/px · z∈[-123,+51]mm · 5 of 30 slices shown (3 of 4)]
[im 1/30]
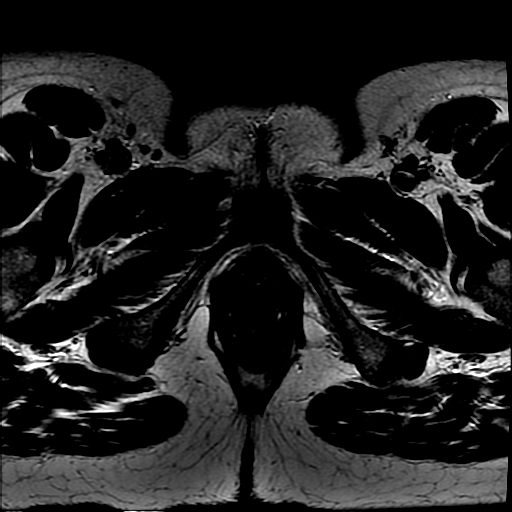
[im 8/30]
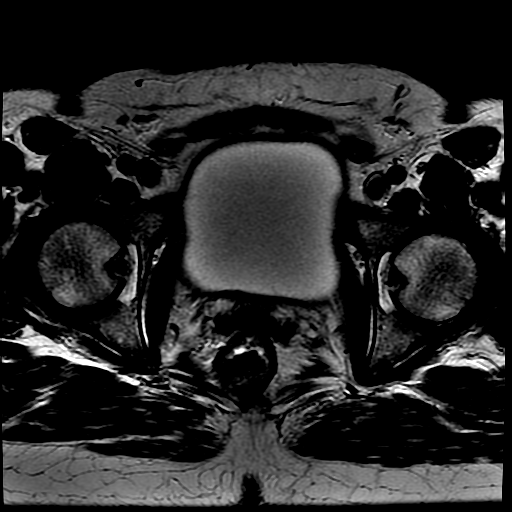
[im 15/30]
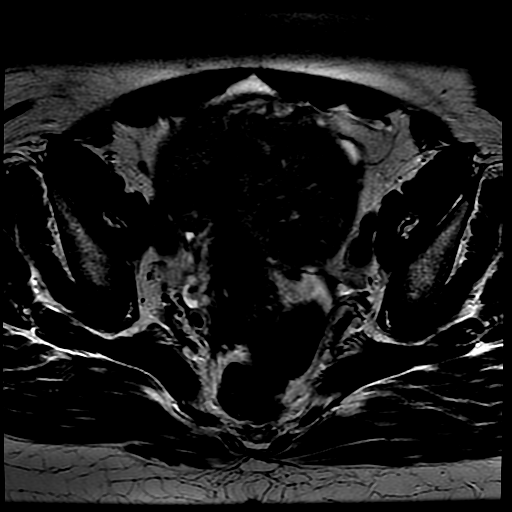
[im 22/30]
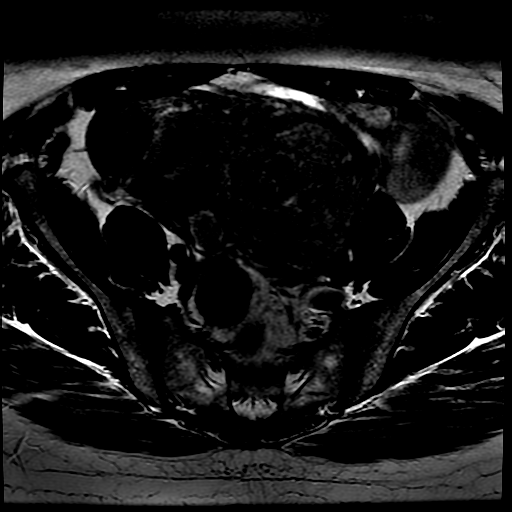
[im 30/30]
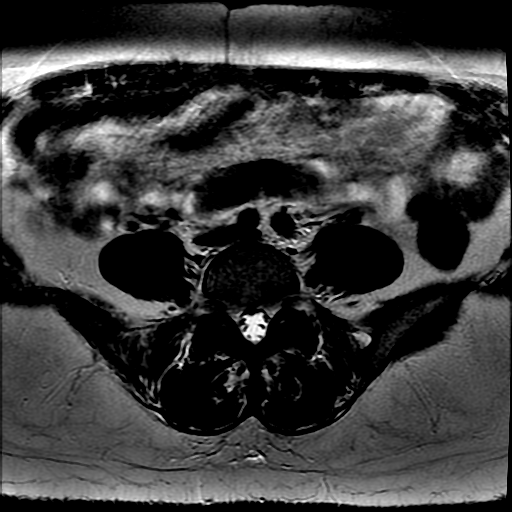

[Series 6: T2 fat-sat · axial · 5.0mm · 0.47mm/px · z∈[-123,+51]mm · 5 of 30 slices shown]
[im 1/30]
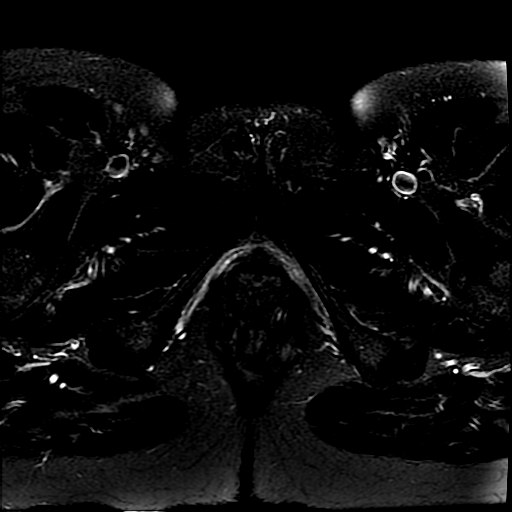
[im 8/30]
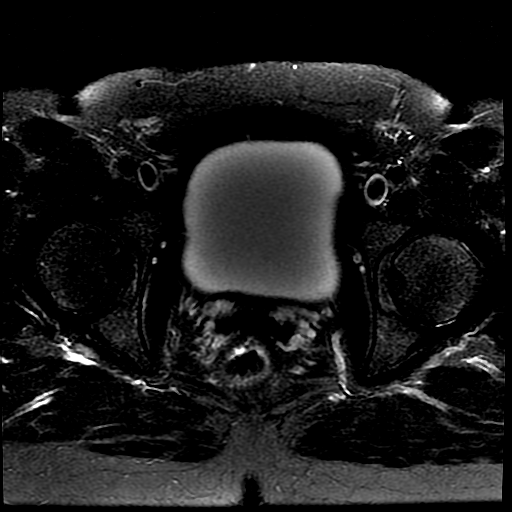
[im 15/30]
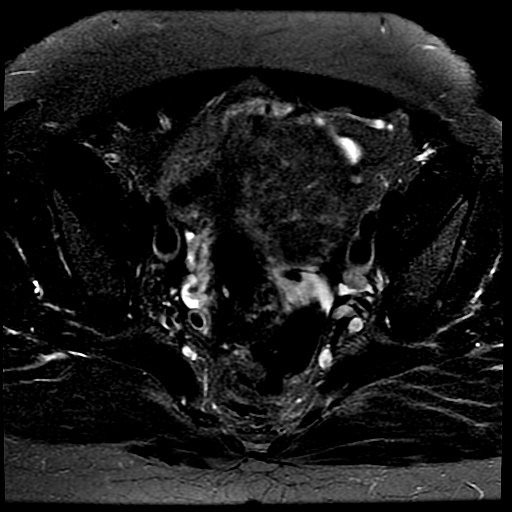
[im 22/30]
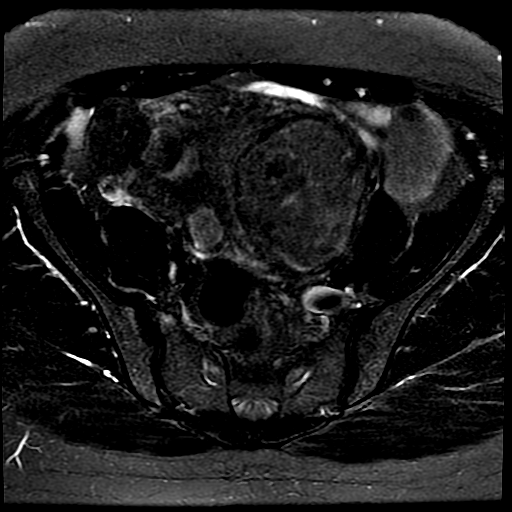
[im 30/30]
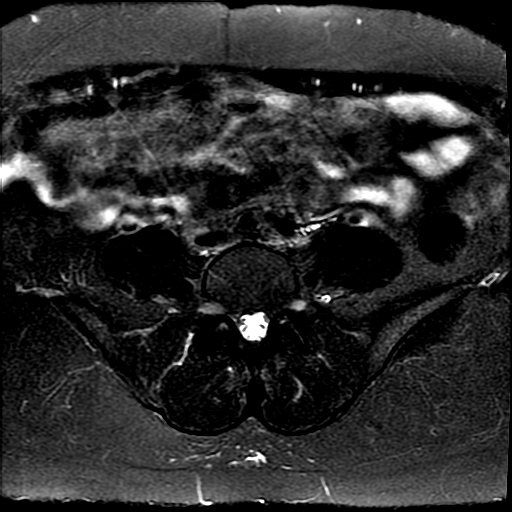

[Series 7: T2 · sagittal · 5.0mm · 0.47mm/px · 1 of 30 slices shown (4 of 4)]
[im 1/30]
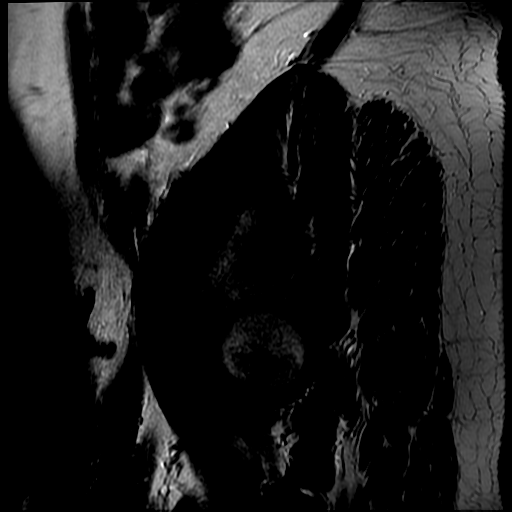

[19 of 48 positions shown; findings below may reference images not displayed]

FINDINGS: The uterus is enlarged by multiple enlarging fibroids. Uterine
dimensions are approximately 14.1 x 9.3 x 13.4 cm. There is a
dominant intramural fibroid within the left uterine body, measuring
up to 7.7 cm, corresponding with the ultrasound finding. This
demonstrates minimal T2 heterogeneity and homogeneous enhancement
following contrast. There is a 2.4 cm submucosal fibroid, protruding
into the endometrial canal. There are few subserosal lesions, but no
pedunculated fibroids are identified.

The endometrium is mildly distorted by the fibroids. No endometrial
thickening or endometrial mass identified.

Both ovaries are visualized and appear normal. There is no evidence
of adnexal mass. The urinary bladder and distal ureters appear
normal. There is no evidence of pelvic lymphadenopathy or ascites.
IMPRESSION: 1. Progressive enlargement of multiple uterine fibroids. The
dominant lesion seen on recent ultrasound is within the left uterine
body, intramural in location. There are few small subserosal and
submucosal lesions as well.
2. No evidence of endometrial thickening or mass.
3. Both ovaries appear unremarkable.

## 2017-03-22 ENCOUNTER — Ambulatory Visit (HOSPITAL_COMMUNITY)
Admission: RE | Admit: 2017-03-22 | Discharge: 2017-03-22 | Disposition: A | Discharge status: Home / Self Care | Payer: Medicaid Other | Source: Ambulatory Visit | Attending: Obstetrics and Gynecology | Admitting: Obstetrics and Gynecology

## 2017-03-22 DIAGNOSIS — D259 Leiomyoma of uterus, unspecified: Secondary | ICD-10-CM | POA: Insufficient documentation

## 2017-03-22 DIAGNOSIS — D251 Intramural leiomyoma of uterus: Secondary | ICD-10-CM | POA: Diagnosis present

## 2017-03-22 DIAGNOSIS — N839 Noninflammatory disorder of ovary, fallopian tube and broad ligament, unspecified: Secondary | ICD-10-CM | POA: Diagnosis not present

## 2017-03-22 DIAGNOSIS — D252 Subserosal leiomyoma of uterus: Secondary | ICD-10-CM

## 2017-04-17 ENCOUNTER — Other Ambulatory Visit: Payer: Self-pay | Admitting: Obstetrics and Gynecology

## 2017-04-18 ENCOUNTER — Other Ambulatory Visit: Payer: Self-pay | Admitting: Obstetrics and Gynecology

## 2017-04-19 NOTE — Patient Instructions (Addendum)
Your procedure is scheduled on:  Thursday, May 02, 2017  Enter through the [employer] of [facility] at:  9:00 AM  Pick up the phone at the desk and dial [phone].  Call this number if you have problems the morning of surgery: [phone].  Remember: Do NOT eat food or drink after:  Midnight Wednesday  Take these medicines the morning of surgery with a SIP OF WATER:  Levothyroxine  Stop ALL herbal medications at this time  Do NOT smoke the day of surgery.  Do NOT wear jewelry (body piercing), metal hair clips/bobby pins, make-up, or nail polish. Do NOT wear lotions, powders, or perfumes.  You may wear deodorant. Do NOT shave for 48 hours prior to surgery. Do NOT bring valuables to the hospital. Contacts, dentures, or bridgework may not be worn into surgery.  Leave suitcase in car.  After surgery it may be brought to your room.  For patients admitted to the hospital, checkout time is 11:00 AM the day of discharge.  Bring a copy of your healthcare power of attorney and living will documents.

## 2017-04-22 ENCOUNTER — Encounter (HOSPITAL_COMMUNITY): Payer: Self-pay

## 2017-04-22 ENCOUNTER — Encounter (HOSPITAL_COMMUNITY)
Admission: RE | Admit: 2017-04-22 | Discharge: 2017-04-22 | Disposition: A | Discharge status: Home / Self Care | Payer: Medicaid Other | Source: Ambulatory Visit | Attending: Obstetrics and Gynecology | Admitting: Obstetrics and Gynecology

## 2017-04-22 DIAGNOSIS — Z01812 Encounter for preprocedural laboratory examination: Secondary | ICD-10-CM | POA: Diagnosis present

## 2017-04-22 DIAGNOSIS — N946 Dysmenorrhea, unspecified: Secondary | ICD-10-CM | POA: Insufficient documentation

## 2017-04-22 LAB — BASIC METABOLIC PANEL
ANION GAP: 8 (ref 5–15)
BUN: 11 mg/dL (ref 6–20)
CHLORIDE: 101 mmol/L (ref 101–111)
CO2: 23 mmol/L (ref 22–32)
Calcium: 9.4 mg/dL (ref 8.9–10.3)
Creatinine, Ser: 0.81 mg/dL (ref 0.44–1.00)
GFR calc Af Amer: >60 mL/min (ref >60)
Glucose, Bld: 126 mg/dL — ABNORMAL HIGH (ref 65–99)
POTASSIUM: 4 mmol/L (ref 3.5–5.1)
SODIUM: 132 mmol/L — AB (ref 135–145)

## 2017-04-22 LAB — CBC
HEMATOCRIT: 40.5 % (ref 36.0–46.0)
HEMOGLOBIN: 14.2 g/dL (ref 12.0–15.0)
MCH: 30.9 pg (ref 26.0–34.0)
MCHC: 35.1 g/dL (ref 30.0–36.0)
MCV: 88.2 fL (ref 78.0–100.0)
Platelets: 333 10*3/uL (ref 150–400)
RBC: 4.59 MIL/uL (ref 3.87–5.11)
RDW: 13.2 % (ref 11.5–15.5)
WBC: 5.5 10*3/uL (ref 4.0–10.5)

## 2017-05-01 NOTE — H&P (Signed)
Kendra Zimmerman is an 46 y.o. female [phone] with long standing H/O dysmenorrhea felt to be secondary to uterine fibroids. She underwent a myomectomy Feb 2017 with removal of 5 fibroids. However she continues to have painful cycles. Heavy at times. She has also had a Novasure in the past as well.  Latest U/S reveals multiple fibroids and possible right hydrosalpinx.  She desires definite therapy. She has received pre op clearance form her PCP.   Pertinent Gynecological History: Menses: heavy at times and painful cramps Contraception: tubal ligation DES exposure: denies Previous GYN Procedures: myomectomy  OB History: G9, [id number]   Menstrual History: Menarche age: 64 No LMP recorded.    Past Medical History:  Diagnosis Date  . Alcohol abuse    sober 20 months  . Anxiety   . Chlamydia   . Hypertension   . Hyperthyroidism   . Syphilis   . Trichomonas     Past Surgical History:  Procedure Laterality Date  . in grown toe nail    . INDUCED ABORTION    . MYOMECTOMY N/A 02/08/2016   Procedure: ABDOMINAL MYOMECTOMY;  Surgeon: Frederico Hamman, MD;  Location: [facility];  Service: Gynecology;  Laterality: N/A;  . NOVASURE ABLATION     2010  . TUBAL LIGATION      Family History  Problem Relation Age of Onset  . Alzheimer's disease Paternal Grandmother   . Hypertension Maternal Grandmother   . Alcohol abuse Maternal Grandmother   . Anesthesia problems Neg Hx   . Hypotension Neg Hx   . Malignant hyperthermia Neg Hx   . Pseudochol deficiency Neg Hx     Social History:  reports that she has never smoked. She has never used smokeless tobacco. She reports that she drinks alcohol. She reports that she does not use drugs.  Allergies: No Known Allergies  No prescriptions prior to admission.    Review of Systems  Constitutional: Negative.   Respiratory: Negative.   Cardiovascular: Negative.   Gastrointestinal: Negative.   Genitourinary: Negative.     There were no vitals  taken for this visit. Physical Exam  Constitutional: She appears well-developed and well-nourished.  Cardiovascular: Normal rate, regular rhythm and normal heart sounds.   Respiratory: Effort normal and breath sounds normal.  GI: Soft. Bowel sounds are normal.  Genitourinary:  Genitourinary Comments: Nl EGBUS, uterus 10-12 weeks mobile, slightly tender, no adnexal masses    No results found for this or any previous visit (from the past 24 hour(s)).  No results found.  Assessment/Plan:  Uterine Fibroids  Dysmenorrhea  Possible hydrosalpinx  Pt is being admitted today for TVH/BS. Risks/Benefits and post op care have been reviewed with the pt. She has verbalized understanding and desires to proceed.   Chancy Milroy 05/01/2017, 3:50 PM

## 2017-05-02 ENCOUNTER — Ambulatory Visit (HOSPITAL_COMMUNITY): Payer: Medicaid Other | Admitting: Certified Registered Nurse Anesthetist

## 2017-05-02 ENCOUNTER — Encounter (HOSPITAL_COMMUNITY)
Admission: RE | Disposition: A | Discharge status: Home / Self Care | Payer: Self-pay | Source: Ambulatory Visit | Attending: Obstetrics and Gynecology

## 2017-05-02 ENCOUNTER — Observation Stay (HOSPITAL_COMMUNITY)
Admission: RE | Admit: 2017-05-02 | Discharge: 2017-05-03 | Disposition: A | Discharge status: Home / Self Care | Payer: Medicaid Other | Source: Ambulatory Visit | Attending: Obstetrics and Gynecology | Admitting: Obstetrics and Gynecology

## 2017-05-02 ENCOUNTER — Encounter (HOSPITAL_COMMUNITY): Payer: Self-pay

## 2017-05-02 DIAGNOSIS — D259 Leiomyoma of uterus, unspecified: Secondary | ICD-10-CM | POA: Diagnosis not present

## 2017-05-02 DIAGNOSIS — N83202 Unspecified ovarian cyst, left side: Secondary | ICD-10-CM | POA: Insufficient documentation

## 2017-05-02 DIAGNOSIS — N946 Dysmenorrhea, unspecified: Secondary | ICD-10-CM | POA: Diagnosis not present

## 2017-05-02 DIAGNOSIS — N800 Endometriosis of uterus: Secondary | ICD-10-CM | POA: Diagnosis present

## 2017-05-02 DIAGNOSIS — D251 Intramural leiomyoma of uterus: Secondary | ICD-10-CM | POA: Insufficient documentation

## 2017-05-02 DIAGNOSIS — I10 Essential (primary) hypertension: Secondary | ICD-10-CM | POA: Insufficient documentation

## 2017-05-02 DIAGNOSIS — N7011 Chronic salpingitis: Secondary | ICD-10-CM | POA: Diagnosis not present

## 2017-05-02 DIAGNOSIS — F419 Anxiety disorder, unspecified: Secondary | ICD-10-CM | POA: Insufficient documentation

## 2017-05-02 DIAGNOSIS — E0590 Thyrotoxicosis, unspecified without thyrotoxic crisis or storm: Secondary | ICD-10-CM | POA: Insufficient documentation

## 2017-05-02 HISTORY — PX: CYSTOSCOPY: SHX5120

## 2017-05-02 HISTORY — PX: SALPINGOOPHORECTOMY: SHX82

## 2017-05-02 HISTORY — PX: OOPHORECTOMY: SHX6387

## 2017-05-02 HISTORY — PX: VAGINAL HYSTERECTOMY: SHX2639

## 2017-05-02 LAB — POCT I-STAT EG7
ACID-BASE EXCESS: 1 mmol/L (ref 0.0–2.0)
Acid-base deficit: 2 mmol/L (ref 0.0–2.0)
BICARBONATE: 22 mmol/L (ref 20.0–28.0)
Bicarbonate: 24.3 mmol/L (ref 20.0–28.0)
CALCIUM ION: 1.2 mmol/L (ref 1.15–1.40)
Calcium, Ion: 1.12 mmol/L — ABNORMAL LOW (ref 1.15–1.40)
HCT: 27 % — ABNORMAL LOW (ref 36.0–46.0)
HCT: 31 % — ABNORMAL LOW (ref 36.0–46.0)
HEMOGLOBIN: 9.2 g/dL — AB (ref 12.0–15.0)
Hemoglobin: 10.5 g/dL — ABNORMAL LOW (ref 12.0–15.0)
O2 Saturation: 98 %
O2 Saturation: 94 %
PCO2 VEN: 34.8 mmHg — AB (ref 44.0–60.0)
PH VEN: 7.456 — AB (ref 7.250–7.430)
Patient temperature: 36.8
Potassium: 3.2 mmol/L — ABNORMAL LOW (ref 3.5–5.1)
Potassium: 3.6 mmol/L (ref 3.5–5.1)
SODIUM: 138 mmol/L (ref 135–145)
Sodium: 138 mmol/L (ref 135–145)
TCO2: 25 mmol/L (ref 0–100)
TCO2: 23 mmol/L (ref 0–100)
pCO2, Ven: 34.5 mmHg — ABNORMAL LOW (ref 44.0–60.0)
pH, Ven: 7.407 (ref 7.250–7.430)
pO2, Ven: 105 mmHg — ABNORMAL HIGH (ref 32.0–45.0)
pO2, Ven: 69 mmHg — ABNORMAL HIGH (ref 32.0–45.0)

## 2017-05-02 LAB — BASIC METABOLIC PANEL
Anion gap: 6 (ref 5–15)
BUN: 15 mg/dL (ref 6–20)
CO2: 22 mmol/L (ref 22–32)
CREATININE: 0.57 mg/dL (ref 0.44–1.00)
Calcium: 7.7 mg/dL — ABNORMAL LOW (ref 8.9–10.3)
Chloride: 107 mmol/L (ref 101–111)
GFR calc Af Amer: >60 mL/min (ref >60)
GLUCOSE: 213 mg/dL — AB (ref 65–99)
POTASSIUM: 3.8 mmol/L (ref 3.5–5.1)
SODIUM: 135 mmol/L (ref 135–145)

## 2017-05-02 LAB — CBC
HCT: 33.1 % — ABNORMAL LOW (ref 36.0–46.0)
HCT: 31 % — ABNORMAL LOW (ref 36.0–46.0)
Hemoglobin: 11.3 g/dL — ABNORMAL LOW (ref 12.0–15.0)
Hemoglobin: 11 g/dL — ABNORMAL LOW (ref 12.0–15.0)
MCH: 29.7 pg (ref 26.0–34.0)
MCH: 30.4 pg (ref 26.0–34.0)
MCHC: 34.1 g/dL (ref 30.0–36.0)
MCHC: 35.5 g/dL (ref 30.0–36.0)
MCV: 87.1 fL (ref 78.0–100.0)
MCV: 85.6 fL (ref 78.0–100.0)
PLATELETS: 185 10*3/uL (ref 150–400)
PLATELETS: 262 10*3/uL (ref 150–400)
RBC: 3.8 MIL/uL — AB (ref 3.87–5.11)
RBC: 3.62 MIL/uL — ABNORMAL LOW (ref 3.87–5.11)
RDW: 13.7 % (ref 11.5–15.5)
RDW: 13.9 % (ref 11.5–15.5)
WBC: 10.8 10*3/uL — ABNORMAL HIGH (ref 4.0–10.5)
WBC: 22.6 10*3/uL — AB (ref 4.0–10.5)

## 2017-05-02 LAB — DIC (DISSEMINATED INTRAVASCULAR COAGULATION) PANEL
FIBRINOGEN: 221 mg/dL (ref 210–475)
PROTHROMBIN TIME: 17.3 s — AB (ref 11.4–15.2)

## 2017-05-02 LAB — PREPARE RBC (CROSSMATCH)

## 2017-05-02 LAB — DIC (DISSEMINATED INTRAVASCULAR COAGULATION)PANEL
D-Dimer, Quant: 0.48 ug/mL-FEU (ref 0.00–0.50)
INR: 1.41
Platelets: 195 10*3/uL (ref 150–400)
Smear Review: NONE SEEN
aPTT: 34 seconds (ref 24–36)

## 2017-05-02 LAB — PREGNANCY, URINE: PREG TEST UR: NEGATIVE

## 2017-05-02 SURGERY — HYSTERECTOMY, VAGINAL
Anesthesia: General | Site: Vagina | Laterality: Right

## 2017-05-02 MED ORDER — DEXAMETHASONE SODIUM PHOSPHATE 10 MG/ML IJ SOLN
INTRAMUSCULAR | Status: DC | PRN
  Administered 2017-05-02: 10 mg via INTRAVENOUS

## 2017-05-02 MED ORDER — KETOROLAC TROMETHAMINE 30 MG/ML IJ SOLN
INTRAMUSCULAR | Status: AC
  Filled 2017-05-02: qty 1

## 2017-05-02 MED ORDER — METHYLENE BLUE 0.5 % INJ SOLN
INTRAVENOUS | Status: AC
  Filled 2017-05-02: qty 10

## 2017-05-02 MED ORDER — HYDROCHLOROTHIAZIDE 12.5 MG PO CAPS
12.5000 mg | ORAL_CAPSULE | Freq: Every day | ORAL | Status: DC
  Filled 2017-05-02: qty 1

## 2017-05-02 MED ORDER — METOCLOPRAMIDE HCL 5 MG/ML IJ SOLN
INTRAMUSCULAR | Status: AC
  Filled 2017-05-02: qty 2

## 2017-05-02 MED ORDER — SOD CITRATE-CITRIC ACID 500-334 MG/5ML PO SOLN
ORAL | Status: AC
  Filled 2017-05-02: qty 15

## 2017-05-02 MED ORDER — ALBUMIN HUMAN 5 % IV SOLN
INTRAVENOUS | Status: DC | PRN
  Administered 2017-05-02: 11:00:00 via INTRAVENOUS

## 2017-05-02 MED ORDER — SUGAMMADEX SODIUM 200 MG/2ML IV SOLN
INTRAVENOUS | Status: DC | PRN
  Administered 2017-05-02: 170.6 mg via INTRAVENOUS

## 2017-05-02 MED ORDER — LEVOTHYROXINE SODIUM 25 MCG PO TABS
25.0000 ug | ORAL_TABLET | Freq: Every day | ORAL | Status: DC
  Administered 2017-05-03: 25 ug via ORAL
  Filled 2017-05-02: qty 1

## 2017-05-02 MED ORDER — LIDOCAINE HCL (CARDIAC) 20 MG/ML IV SOLN
INTRAVENOUS | Status: AC
  Filled 2017-05-02: qty 5

## 2017-05-02 MED ORDER — ACETAMINOPHEN 500 MG PO TABS
1000.0000 mg | ORAL_TABLET | Freq: Once | ORAL | Status: AC
  Administered 2017-05-02: 1000 mg via ORAL
  Filled 2017-05-02: qty 2

## 2017-05-02 MED ORDER — SOD CITRATE-CITRIC ACID 500-334 MG/5ML PO SOLN
30.0000 mL | ORAL | Status: AC
  Administered 2017-05-02: 30 mL via ORAL

## 2017-05-02 MED ORDER — DEXTROSE IN LACTATED RINGERS 5 % IV SOLN: INTRAVENOUS | Status: DC

## 2017-05-02 MED ORDER — FENTANYL CITRATE (PF) 250 MCG/5ML IJ SOLN
INTRAMUSCULAR | Status: AC
  Filled 2017-05-02: qty 5

## 2017-05-02 MED ORDER — IBUPROFEN 800 MG PO TABS: 800.0000 mg | ORAL_TABLET | Freq: Three times a day (TID) | ORAL | Status: DC | PRN

## 2017-05-02 MED ORDER — METHYLENE BLUE 0.5 % INJ SOLN
INTRAVENOUS | Status: DC | PRN
  Administered 2017-05-02: 4 mL via INTRAVENOUS

## 2017-05-02 MED ORDER — ONDANSETRON HCL 4 MG/2ML IJ SOLN: 4.0000 mg | Freq: Four times a day (QID) | INTRAMUSCULAR | Status: DC | PRN

## 2017-05-02 MED ORDER — DEXTROSE IN LACTATED RINGERS 5 % IV SOLN
INTRAVENOUS | Status: DC
  Administered 2017-05-02: 15:00:00 via INTRAVENOUS

## 2017-05-02 MED ORDER — KETOROLAC TROMETHAMINE 30 MG/ML IJ SOLN
30.0000 mg | Freq: Four times a day (QID) | INTRAMUSCULAR | Status: AC
  Administered 2017-05-02 – 2017-05-03 (×4): 30 mg via INTRAVENOUS
  Filled 2017-05-02 (×3): qty 1

## 2017-05-02 MED ORDER — HYDROMORPHONE HCL 1 MG/ML IJ SOLN
INTRAMUSCULAR | Status: AC
  Filled 2017-05-02: qty 1

## 2017-05-02 MED ORDER — DEXAMETHASONE SODIUM PHOSPHATE 10 MG/ML IJ SOLN
INTRAMUSCULAR | Status: AC
  Filled 2017-05-02: qty 1

## 2017-05-02 MED ORDER — HYDROMORPHONE HCL 1 MG/ML IJ SOLN: 1.0000 mg | INTRAMUSCULAR | Status: DC | PRN

## 2017-05-02 MED ORDER — LISINOPRIL-HYDROCHLOROTHIAZIDE 20-12.5 MG PO TABS: 1.0000 | ORAL_TABLET | Freq: Every day | ORAL | Status: DC

## 2017-05-02 MED ORDER — LACTATED RINGERS IV SOLN: INTRAVENOUS | Status: DC

## 2017-05-02 MED ORDER — MIDAZOLAM HCL 2 MG/2ML IJ SOLN
INTRAMUSCULAR | Status: DC | PRN
  Administered 2017-05-02: 2 mg via INTRAVENOUS

## 2017-05-02 MED ORDER — CEFAZOLIN SODIUM-DEXTROSE 2-4 GM/100ML-% IV SOLN
2.0000 g | INTRAVENOUS | Status: AC
  Administered 2017-05-02: 2 g via INTRAVENOUS

## 2017-05-02 MED ORDER — MEPERIDINE HCL 25 MG/ML IJ SOLN: 6.2500 mg | INTRAMUSCULAR | Status: DC | PRN

## 2017-05-02 MED ORDER — FENTANYL CITRATE (PF) 100 MCG/2ML IJ SOLN
INTRAMUSCULAR | Status: DC | PRN
  Administered 2017-05-02: 100 ug via INTRAVENOUS
  Administered 2017-05-02: 50 ug via INTRAVENOUS
  Administered 2017-05-02: 100 ug via INTRAVENOUS
  Administered 2017-05-02 (×2): 50 ug via INTRAVENOUS

## 2017-05-02 MED ORDER — ONDANSETRON HCL 4 MG/2ML IJ SOLN
INTRAMUSCULAR | Status: DC | PRN
  Administered 2017-05-02: 4 mg via INTRAVENOUS

## 2017-05-02 MED ORDER — KETOROLAC TROMETHAMINE 30 MG/ML IJ SOLN: 30.0000 mg | Freq: Four times a day (QID) | INTRAMUSCULAR | Status: AC

## 2017-05-02 MED ORDER — FENTANYL CITRATE (PF) 100 MCG/2ML IJ SOLN
25.0000 ug | INTRAMUSCULAR | Status: DC | PRN
  Administered 2017-05-02: 25 ug via INTRAVENOUS
  Administered 2017-05-02: 50 ug via INTRAVENOUS

## 2017-05-02 MED ORDER — ROCURONIUM BROMIDE 100 MG/10ML IV SOLN
INTRAVENOUS | Status: AC
  Filled 2017-05-02: qty 1

## 2017-05-02 MED ORDER — SOD CITRATE-CITRIC ACID 500-334 MG/5ML PO SOLN
30.0000 mL | ORAL | Status: AC
Start: 2017-05-02 — End: 2017-05-02

## 2017-05-02 MED ORDER — CEFAZOLIN SODIUM-DEXTROSE 2-4 GM/100ML-% IV SOLN: 2.0000 g | INTRAVENOUS | Status: DC

## 2017-05-02 MED ORDER — SUGAMMADEX SODIUM 200 MG/2ML IV SOLN
INTRAVENOUS | Status: AC
  Filled 2017-05-02: qty 2

## 2017-05-02 MED ORDER — SCOPOLAMINE 1 MG/3DAYS TD PT72
1.0000 | MEDICATED_PATCH | Freq: Once | TRANSDERMAL | Status: DC
  Administered 2017-05-02: 1.5 mg via TRANSDERMAL

## 2017-05-02 MED ORDER — PHENYLEPHRINE HCL 10 MG/ML IJ SOLN
INTRAMUSCULAR | Status: DC | PRN
  Administered 2017-05-02 (×3): 40 ug via INTRAVENOUS

## 2017-05-02 MED ORDER — SODIUM CHLORIDE 0.9 % IV SOLN
INTRAVENOUS | Status: DC | PRN
  Administered 2017-05-02: 12:00:00 via INTRAVENOUS

## 2017-05-02 MED ORDER — METOCLOPRAMIDE HCL 5 MG/ML IJ SOLN
10.0000 mg | Freq: Once | INTRAMUSCULAR | Status: AC | PRN
  Administered 2017-05-02: 10 mg via INTRAVENOUS

## 2017-05-02 MED ORDER — PROPOFOL 10 MG/ML IV BOLUS
INTRAVENOUS | Status: AC
  Filled 2017-05-02: qty 20

## 2017-05-02 MED ORDER — LACTATED RINGERS IV SOLN
INTRAVENOUS | Status: DC
  Administered 2017-05-02 (×3): via INTRAVENOUS
  Administered 2017-05-02: 100 mL/h via INTRAVENOUS

## 2017-05-02 MED ORDER — OXYCODONE-ACETAMINOPHEN 5-325 MG PO TABS
1.0000 | ORAL_TABLET | ORAL | Status: DC | PRN
  Administered 2017-05-03: 2 via ORAL
  Filled 2017-05-02: qty 2

## 2017-05-02 MED ORDER — SCOPOLAMINE 1 MG/3DAYS TD PT72
MEDICATED_PATCH | TRANSDERMAL | Status: AC
  Filled 2017-05-02: qty 1

## 2017-05-02 MED ORDER — HYDROMORPHONE HCL 1 MG/ML IJ SOLN
INTRAMUSCULAR | Status: DC | PRN
  Administered 2017-05-02: 1 mg via INTRAVENOUS

## 2017-05-02 MED ORDER — MIDAZOLAM HCL 2 MG/2ML IJ SOLN
INTRAMUSCULAR | Status: AC
  Filled 2017-05-02: qty 2

## 2017-05-02 MED ORDER — LISINOPRIL 20 MG PO TABS
20.0000 mg | ORAL_TABLET | Freq: Every day | ORAL | Status: DC
  Administered 2017-05-02: 20 mg via ORAL
  Filled 2017-05-02: qty 1

## 2017-05-02 MED ORDER — PROPOFOL 10 MG/ML IV BOLUS
INTRAVENOUS | Status: DC | PRN
  Administered 2017-05-02: 200 mg via INTRAVENOUS

## 2017-05-02 MED ORDER — FENTANYL CITRATE (PF) 100 MCG/2ML IJ SOLN
INTRAMUSCULAR | Status: AC
  Filled 2017-05-02: qty 2

## 2017-05-02 MED ORDER — LIDOCAINE HCL (CARDIAC) 20 MG/ML IV SOLN
INTRAVENOUS | Status: DC | PRN
  Administered 2017-05-02: 100 mg via INTRAVENOUS

## 2017-05-02 MED ORDER — DOCUSATE SODIUM 100 MG PO CAPS
100.0000 mg | ORAL_CAPSULE | Freq: Two times a day (BID) | ORAL | Status: DC
  Administered 2017-05-02: 100 mg via ORAL
  Filled 2017-05-02: qty 1

## 2017-05-02 MED ORDER — ZOLPIDEM TARTRATE 5 MG PO TABS: 5.0000 mg | ORAL_TABLET | Freq: Every evening | ORAL | Status: DC | PRN

## 2017-05-02 MED ORDER — ONDANSETRON HCL 4 MG/2ML IJ SOLN
INTRAMUSCULAR | Status: AC
  Filled 2017-05-02: qty 2

## 2017-05-02 MED ORDER — ROCURONIUM BROMIDE 100 MG/10ML IV SOLN
INTRAVENOUS | Status: DC | PRN
  Administered 2017-05-02: 30 mg via INTRAVENOUS

## 2017-05-02 SURGICAL SUPPLY — 29 items
CANISTER SUCT 3000ML PPV (MISCELLANEOUS) ×6 IMPLANT
CLOTH BEACON ORANGE TIMEOUT ST (SAFETY) ×6 IMPLANT
CONT PATH 16OZ SNAP LID 3702 (MISCELLANEOUS) IMPLANT
DECANTER SPIKE VIAL GLASS SM (MISCELLANEOUS) IMPLANT
GAUZE SPONGE 4X4 16PLY XRAY LF (GAUZE/BANDAGES/DRESSINGS) ×18 IMPLANT
GLOVE BIO SURGEON STRL SZ7.5 (GLOVE) ×12 IMPLANT
GLOVE BIOGEL PI IND STRL 6.5 (GLOVE) ×5 IMPLANT
GLOVE BIOGEL PI IND STRL 7.0 (GLOVE) ×10 IMPLANT
GLOVE BIOGEL PI INDICATOR 6.5 (GLOVE) ×1
GLOVE BIOGEL PI INDICATOR 7.0 (GLOVE) ×2
GOWN STRL REUS W/TWL LRG LVL3 (GOWN DISPOSABLE) ×18 IMPLANT
GOWN STRL REUS W/TWL XL LVL3 (GOWN DISPOSABLE) ×6 IMPLANT
NS IRRIG 1000ML POUR BTL (IV SOLUTION) ×6 IMPLANT
PACK TRENDGUARD 600 HYBRD PROC (MISCELLANEOUS) IMPLANT
PACK VAGINAL WOMENS (CUSTOM PROCEDURE TRAY) ×6 IMPLANT
PAD OB MATERNITY 4.3X12.25 (PERSONAL CARE ITEMS) ×6 IMPLANT
SET CYSTO W/LG BORE CLAMP LF (SET/KITS/TRAYS/PACK) ×6 IMPLANT
SUT VIC AB 1 CT1 18XBRD ANBCTR (SUTURE) ×10 IMPLANT
SUT VIC AB 1 CT1 8-18 (SUTURE) ×2
SUT VIC AB 2-0 CT1 18 (SUTURE) ×6 IMPLANT
SUT VIC AB 2-0 CT1 27 (SUTURE) ×2
SUT VIC AB 2-0 CT1 TAPERPNT 27 (SUTURE) ×5 IMPLANT
SUT VIC AB PLUS 45CM 1-MO-4 (SUTURE) ×24 IMPLANT
SUT VICRYL 0 ENDOLOOP (SUTURE) IMPLANT
SUT VICRYL 1 TIES 12X18 (SUTURE) ×6 IMPLANT
TOWEL OR 17X24 6PK STRL BLUE (TOWEL DISPOSABLE) ×12 IMPLANT
TRAY FOLEY CATH SILVER 14FR (SET/KITS/TRAYS/PACK) ×6 IMPLANT
TRENDGUARD 600 HYBRID PROC PK (MISCELLANEOUS)
WATER STERILE IRR 1000ML POUR (IV SOLUTION) ×6 IMPLANT

## 2017-05-02 NOTE — Op Note (Signed)
Kendra Zimmerman PROCEDURE DATE: 05/02/2017  PREOPERATIVE DIAGNOSIS:  Symptomatic fibroids, dysmenorrhea POSTOPERATIVE DIAGNOSIS:  Symptomatic fibroids, dysmenorrhea and right hydrosalpinx SURGEON:   Chancy Milroy M.D., FACOG ASSISTANT: Emeterio Reeve, M.D. OPERATION:  Total Vaginal hysterectomy with right salpingectomy and left salpinoophorectomy and cystoscopy ANESTHESIA:  General endotracheal.  INDICATIONS: The patient is a 46 y.o. [id number] with history of symptomatic uterine fibroids and dysmenorrhea. The patient made a decision to undergo definite surgical treatment. On the preoperative visit, the risks, benefits, indications, and alternatives of the procedure were reviewed with the patient.  On the day of surgery, the risks of surgery were again discussed with the patient including but not limited to: bleeding which may require transfusion or reoperation; infection which may require antibiotics; injury to bowel, bladder, ureters or other surrounding organs; need for additional procedures; thromboembolic phenomenon, incisional problems and other postoperative/anesthesia complications. Written informed consent was obtained.    OPERATIVE FINDINGS: A 12 week size uterus with right hydrosalpinx and adhesions involving the both tubes and ovaries.   ESTIMATED BLOOD LOSS: 2800 ml FLUIDS:  3700 ml of Lactated Ringers URINE OUTPUT:  375 ml of clear yellow urine. 2 units of PRBCs and 250 cc of Albumin SPECIMENS:  Uterus, cervix, right tube and left tube and ovary sent to pathology COMPLICATIONS:  None immediate.  DESCRIPTION OF PROCEDURE:  The patient received intravenous antibiotics and had sequential compression devices applied to her lower extremities while in the preoperative area.  She was then taken to the operating room where general anesthesia was administered and was found to be adequate.  She was placed in the dorsal lithotomy position, and was prepped and draped in a sterile manner. Time  out was performed.  The patients bladder was drained with a foley was left in dwelling.  A weighted speculum was then placed in the vagina,  posterior lip of the cervix were grasped with tenaculum.  The posterior cul-de-sac was entered sharply. Long bill weighted speculum was placed. Attention was directed anteriorly, the cervix was incised circumferentially. The  bladder was dissected off the pubocervical fascia without complication.  Zeplin clamps were then used to clamp the uterosacral ligaments on either side.  They were then cut and sutured ligated with 0 Vicryl, and the ligated uterosacral ligaments were transfixed to the posterior lateral vaginal epithelium to further support the vagina and provide hemostasis. The cardinal ligaments were then clamped, cut and ligated in same manner with the suture to the level of the uterine arteries. There was a large uterine fibroid anteriorly which made entry anterior difficult.   It was elected at that time to [facility] the uterus. The cervix was grasped at the 3 and 9 o'clock position. The cervix was bivalved. Wedge sections of the uterus were removed. A large fibroid was encountered and was dissected free from the underlying myometrium. The fundus of the uterus was delivered thru the vagina. The anterior peritoneum was then easily noted and entry was gained anterior. The remainder of the cardinal ligaments were clamped and ligated. The final pedicle on both sides involving the uterine ovarian were cross clamped. Pedicle was secure with a free tie and then in a Canute fashion.   Attention was then directed to the right tube which was dilated, consistent with a hydrosalpinx. The tube was noted to be adhered to the ovary. The tube and mesosalpinx was clamped hugging the ovary. The tube was removed and pedicle was secured with two free times. Hemostasis was noted.   The left  tube and ovary were grasped with Kary Kos. Again adhesions involving the tube and ovary were  noted. The tube was crossed clamped, removed and pedicle was secured. There was a rent in the uterine ovarian pedicle. Attempts to secure and to conserve the ovary were unsuccessful. Thus secondary to blood loss it was elected to removed the ovary. The IP was cross clamped with a Zeplin. The ovary was removed . The pedicle was secured with a free tie and a 0 Vicryl in a Bonney fashion.  All pedicles from the uterosacral ligaments to the IP were reviewed and interrupted sutures were placed to obtain hemostasis.   During the blood loss, pt received IV fluids, PRBC's and Albumin. Intraoperative H/H was 11/33. BP, HR and UOP remained stable throughout the procedure.  The peritoneum was then closed in a purse string fashion with 2/0 Vicryl. The vaginal cuff was closed with 3/0 Vicryl.   Cystoscopy with a 70 degree cystoscope was performed. After the foley cathter was removed. The bladder mucosa appeared normal, both utereral offices were noted and efflux of Methaine blue was noted.    The patient tolerated the procedure well.  All instruments, needles, and sponge counts were correct x 2. The patient was taken to the recovery room in stable condition.

## 2017-05-02 NOTE — Progress Notes (Signed)
Patient ID: Kendra Zimmerman, female   DOB: 1971-08-28, 46 y.o.   MRN: [id number] DOS Note  Pt without complaints. Pain controlled. Tolerating clear liquids would like to have something more.  PE AF VSS Good UOP Lungs clear  Heart RRR Abd soft +BS GU foley blue tinged urine Ext SCD's  Labs stable  A/P Stable        Continue with routine post op care. Advance diet as tolerates Events of surgery reviewed with pt. Indications for blood transfusion and removal of ovary discussed also. Pt verbalized understanding.

## 2017-05-02 NOTE — Anesthesia Preprocedure Evaluation (Signed)
Anesthesia Evaluation  Patient identified by MRN, date of birth, ID band Patient awake    Reviewed: Allergy & Precautions, NPO status , Patient's Chart, lab work & pertinent test results  Airway Mallampati: I  TM Distance: >3 FB Neck ROM: Full    Dental no notable dental hx.    Pulmonary neg pulmonary ROS,    Pulmonary exam normal breath sounds clear to auscultation       Cardiovascular hypertension, Pt. on medications Normal cardiovascular exam Rhythm:Regular Rate:Normal     Neuro/Psych negative neurological ROS  negative psych ROS   GI/Hepatic negative GI ROS, (+)     substance abuse  alcohol use,   Endo/Other  negative endocrine ROS  Renal/GU negative Renal ROS  negative genitourinary   Musculoskeletal negative musculoskeletal ROS (+)   Abdominal   Peds negative pediatric ROS (+)  Hematology negative hematology ROS (+)   Anesthesia Other Findings   Reproductive/Obstetrics negative OB ROS                            Anesthesia Physical Anesthesia Plan  ASA: II  Anesthesia Plan: General   Post-op Pain Management:    Induction: Intravenous  Airway Management Planned: Oral ETT  Additional Equipment:   Intra-op Plan:   Post-operative Plan: Extubation in OR  Informed Consent: I have reviewed the patients History and Physical, chart, labs and discussed the procedure including the risks, benefits and alternatives for the proposed anesthesia with the patient or authorized representative who has indicated his/her understanding and acceptance.   Dental advisory given  Plan Discussed with: CRNA  Anesthesia Plan Comments:         Anesthesia Quick Evaluation

## 2017-05-02 NOTE — Anesthesia Procedure Notes (Signed)
Procedure Name: Intubation Date/Time: 05/02/2017 8:52 AM Performed by: Hewitt Blade Pre-anesthesia Checklist: Patient identified, Emergency Drugs available, Suction available and Patient being monitored Patient Re-evaluated:Patient Re-evaluated prior to inductionOxygen Delivery Method: Circle system utilized Preoxygenation: Pre-oxygenation with 100% oxygen Intubation Type: IV induction Ventilation: Mask ventilation without difficulty Laryngoscope Size: Mac and 3 Grade View: Grade I Tube type: Oral Tube size: 7.0 mm Number of attempts: 1 Airway Equipment and Method: Stylet Placement Confirmation: ETT inserted through vocal cords under direct vision,  positive ETCO2 and breath sounds checked- equal and bilateral Secured at: 22 cm Tube secured with: Tape Dental Injury: Teeth and Oropharynx as per pre-operative assessment

## 2017-05-02 NOTE — Transfer of Care (Signed)
Immediate Anesthesia Transfer of Care Note  Patient: Etta Quill  Procedure(s) Performed: Procedure(s): HYSTERECTOMY VAGINAL (N/A) RIGHT SALPINGECTOMY (Right) CYSTOSCOPY (N/A) SALPINGO-OOPHORECTOMY (Left)  Patient Location: PACU  Anesthesia Type:General  Level of Consciousness: awake, alert  and oriented  Airway & Oxygen Therapy: Patient Spontanous Breathing and Patient connected to nasal cannula oxygen  Post-op Assessment: Report given to RN, Post -op Vital signs reviewed and stable and Patient moving all extremities  Post vital signs: Reviewed and stable  Last Vitals: There were no vitals filed for this visit.  Last Pain: There were no vitals filed for this visit.       Complications: No apparent anesthesia complications

## 2017-05-02 NOTE — Anesthesia Postprocedure Evaluation (Signed)
Anesthesia Post Note  Patient: Kendra Zimmerman  Procedure(s) Performed: Procedure(s) (LRB): HYSTERECTOMY VAGINAL (N/A) RIGHT SALPINGECTOMY (Right) CYSTOSCOPY (N/A) SALPINGO-OOPHORECTOMY (Left)  Patient location during evaluation: [facility] Anesthesia Type: General Level of consciousness: awake and alert Pain management: pain level controlled Vital Signs Assessment: post-procedure vital signs reviewed and stable Respiratory status: spontaneous breathing, nonlabored ventilation and respiratory function stable Cardiovascular status: blood pressure returned to baseline and stable Postop Assessment: no signs of nausea or vomiting Anesthetic complications: no        Last Vitals:  Vitals:   05/02/17 1450 05/02/17 1550  BP: 102/60 112/72  Pulse:  96  Resp: 20 18  Temp: 36.5 C 37.1 C    Last Pain:  Vitals:   05/02/17 1730  TempSrc:   PainSc: 2    Pain Goal: Patients Stated Pain Goal: 2 (05/02/17 1540)               Riki Sheer

## 2017-05-02 NOTE — Interval H&P Note (Signed)
History and Physical Interval Note:  05/02/2017 8:01 AM  Kendra Zimmerman  has presented today for surgery, with the diagnosis of AUB  The various methods of treatment have been discussed with the patient and family. After consideration of risks, benefits and other options for treatment, the patient has consented to  Procedure(s): HYSTERECTOMY VAGINAL (N/A) BILATERAL SALPINGECTOMY (Bilateral) as a surgical intervention .  The patient's history has been reviewed, patient examined, no change in status, stable for surgery.  I have reviewed the patient's chart and labs.  Questions were answered to the patient's satisfaction.     Chancy Milroy

## 2017-05-03 ENCOUNTER — Encounter (HOSPITAL_COMMUNITY): Payer: Self-pay | Admitting: Obstetrics and Gynecology

## 2017-05-03 DIAGNOSIS — N800 Endometriosis of uterus: Secondary | ICD-10-CM | POA: Diagnosis not present

## 2017-05-03 LAB — CBC
HCT: 24.4 % — ABNORMAL LOW (ref 36.0–46.0)
HEMOGLOBIN: 8.8 g/dL — AB (ref 12.0–15.0)
MCH: 30.6 pg (ref 26.0–34.0)
MCHC: 36.1 g/dL — AB (ref 30.0–36.0)
MCV: 84.7 fL (ref 78.0–100.0)
Platelets: 223 10*3/uL (ref 150–400)
RBC: 2.88 MIL/uL — ABNORMAL LOW (ref 3.87–5.11)
RDW: 14.2 % (ref 11.5–15.5)
WBC: 16.6 10*3/uL — ABNORMAL HIGH (ref 4.0–10.5)

## 2017-05-03 LAB — BASIC METABOLIC PANEL
Anion gap: 5 (ref 5–15)
BUN: 13 mg/dL (ref 6–20)
CHLORIDE: 105 mmol/L (ref 101–111)
CO2: 27 mmol/L (ref 22–32)
CREATININE: 0.66 mg/dL (ref 0.44–1.00)
Calcium: 8 mg/dL — ABNORMAL LOW (ref 8.9–10.3)
GFR calc Af Amer: >60 mL/min (ref >60)
GLUCOSE: 118 mg/dL — AB (ref 65–99)
Potassium: 3.3 mmol/L — ABNORMAL LOW (ref 3.5–5.1)
SODIUM: 137 mmol/L (ref 135–145)

## 2017-05-03 MED ORDER — DOCUSATE SODIUM 100 MG PO CAPS: 100.0000 mg | ORAL_CAPSULE | Freq: Two times a day (BID) | ORAL | 0 refills | Status: AC

## 2017-05-03 MED ORDER — OXYCODONE-ACETAMINOPHEN 5-325 MG PO TABS: 1.0000 | ORAL_TABLET | ORAL | 0 refills | Status: DC | PRN

## 2017-05-03 MED ORDER — IBUPROFEN 800 MG PO TABS: 800.0000 mg | ORAL_TABLET | Freq: Three times a day (TID) | ORAL | 0 refills | Status: DC | PRN

## 2017-05-03 NOTE — Discharge Summary (Signed)
Physician Discharge Summary  Patient ID: Kendra Zimmerman MRN: [id number] DOB/AGE: [birth year] 47 y.o.  Admit date: 05/02/2017 Discharge date: 05/03/2017  Admission Diagnoses:  Discharge Diagnoses:  Active Problems:   Uterine fibroid   Discharged Condition: good  Hospital Course: Pt was admitted for TVH/BS secondary to the above listed Dx. See OP note for additional information. Pt's post op course was unremarkable. She progressed to ambulating, voiding without problems and + flatus. Tolerating diet. Good oral pain control.  Felt amendable for d/c home.   Consults: None  Significant Diagnostic Studies: labs:   Treatments: surgery: TVH/RS/LSO  Discharge Exam: Blood pressure (!) 105/52, pulse 82, temperature 98.5 F (36.9 C), temperature source Oral, resp. rate 18, height 5\' 3"  (1.6 m), SpO2 98 %.   Lungs clear Heart RRR Abd soft + BS GU no bleeding Ext non tender  Disposition: 01-Home or Self Care  Discharge Instructions    Call MD for:  extreme fatigue    Complete by:  As directed    Call MD for:  persistant dizziness or light-headedness    Complete by:  As directed    Call MD for:  persistant nausea and vomiting    Complete by:  As directed    Call MD for:  redness, tenderness, or signs of infection (pain, swelling, redness, odor or green/yellow discharge around incision site)    Complete by:  As directed    Call MD for:  severe uncontrolled pain    Complete by:  As directed    Call MD for:  temperature >100.4    Complete by:  As directed    Diet general    Complete by:  As directed    Driving Restrictions    Complete by:  As directed    No driving for 7 days   Increase activity slowly    Complete by:  As directed    Sexual Activity Restrictions    Complete by:  As directed    Pelvic rest until post op appt      Follow-up Information    [facility] Follow up in 4 week(s).   Why:  Post op appt with Dr. Arlina Robes Contact information: [address] [address] [phone]          Signed: Chancy Milroy 05/03/2017, 7:28 AM

## 2017-05-03 NOTE — Discharge Instructions (Signed)
Vaginal Hysterectomy, Care After °Refer to this sheet in the next few weeks. These instructions provide you with information about caring for yourself after your procedure. Your health care provider may also give you more specific instructions. Your treatment has been planned according to current medical practices, but problems sometimes occur. Call your health care provider if you have any problems or questions after your procedure. °What can I expect after the procedure? °After the procedure, it is common to have: °· Pain. °· Soreness and numbness in your incision areas. °· Vaginal bleeding and discharge. °· Constipation. °· Temporary problems emptying the bladder. °· Feelings of sadness or other emotions. °Follow these instructions at home: °Medicines  °· Take over-the-counter and prescription medicines only as told by your health care provider. °· If you were prescribed an antibiotic medicine, take it as told by your health care provider. Do not stop taking the antibiotic even if you start to feel better. °· Do not drive or operate heavy machinery while taking prescription pain medicine. °Activity  °· Return to your normal activities as told by your health care provider. Ask your health care provider what activities are safe for you. °· Get regular exercise as told by your health care provider. You may be told to take short walks every day and go farther each time. °· Do not lift anything that is heavier than 10 lb (4.5 kg). °General instructions  ° °· Do not put anything in your vagina for 6 weeks after your surgery or as told by your health care provider. This includes tampons and douches. °· Do not have sex until your health care provider says you can. °· Do not take baths, swim, or use a hot tub until your health care provider approves. °· Drink enough fluid to keep your urine clear or pale yellow. °· Do not drive for 24 hours if you were given a sedative. °· Keep all follow-up visits as told by your health  care provider. This is important. °Contact a health care provider if: °· Your pain medicine is not helping. °· You have a fever. °· You have redness, swelling, or pain at your incision site. °· You have blood, pus, or a bad-smelling discharge from your vagina. °· You continue to have difficulty urinating. °Get help right away if: °· You have severe abdominal or back pain. °· You have heavy bleeding from your vagina. °· You have chest pain or shortness of breath. °This information is not intended to replace advice given to you by your health care provider. Make sure you discuss any questions you have with your health care provider. °Document Released: 03/26/2016 Document Revised: 05/10/2016 Document Reviewed: 12/18/2015 °Elsevier Interactive Patient Education © 2017 [employer] ° °

## 2017-05-04 LAB — TYPE AND SCREEN
ABO/RH(D): O POS
ANTIBODY SCREEN: NEGATIVE
UNIT DIVISION: 0
UNIT DIVISION: 0
Unit division: 0
Unit division: 0

## 2017-05-04 LAB — BPAM RBC
BLOOD PRODUCT EXPIRATION DATE: 201805292359
BLOOD PRODUCT EXPIRATION DATE: 201805292359
BLOOD PRODUCT EXPIRATION DATE: 201806052359
Blood Product Expiration Date: 201806052359
ISSUE DATE / TIME: 201805171025
ISSUE DATE / TIME: 201805171025
ISSUE DATE / TIME: 201805171054
ISSUE DATE / TIME: 201805171054
UNIT TYPE AND RH: 5100
UNIT TYPE AND RH: 9500
UNIT TYPE AND RH: 9500
Unit Type and Rh: 5100

## 2017-05-04 LAB — PREPARE FRESH FROZEN PLASMA
UNIT DIVISION: 0
Unit division: 0

## 2017-05-04 LAB — BPAM FFP
Blood Product Expiration Date: 201805222359
Blood Product Expiration Date: 201805222359
UNIT TYPE AND RH: 5100
UNIT TYPE AND RH: 9500

## 2017-05-08 ENCOUNTER — Encounter (HOSPITAL_COMMUNITY): Payer: Self-pay

## 2017-05-22 ENCOUNTER — Ambulatory Visit (INDEPENDENT_AMBULATORY_CARE_PROVIDER_SITE_OTHER): Payer: Medicaid Other | Admitting: Obstetrics and Gynecology

## 2017-05-22 ENCOUNTER — Encounter: Payer: Self-pay | Admitting: *Deleted

## 2017-05-22 ENCOUNTER — Encounter: Payer: Self-pay | Admitting: Obstetrics and Gynecology

## 2017-05-22 DIAGNOSIS — Z9889 Other specified postprocedural states: Secondary | ICD-10-CM | POA: Insufficient documentation

## 2017-05-22 NOTE — Progress Notes (Signed)
Post OP visit  Pt here for post op visit from TVH/BS completed 4 weeks ago. She has no complaints. Ambulating without problems. Denies any bowel or bladder dysfunction. No pain. Tolerating diet.  PE AF VSS Lungs clear Heart RRR Abd soft + NS non tender GU cuff healing well scant brownish discharge non tender  A/P S/P TVH/BS Doing well. Return to normal ADL's. Return to work note for June 11 provided to pt. Pathology reviewed with pt. F/U in 1 yr or PRN

## 2017-05-22 NOTE — Patient Instructions (Signed)

## 2017-09-13 ENCOUNTER — Other Ambulatory Visit: Payer: Self-pay | Admitting: Internal Medicine

## 2017-09-13 DIAGNOSIS — Z1231 Encounter for screening mammogram for malignant neoplasm of breast: Secondary | ICD-10-CM

## 2017-10-29 ENCOUNTER — Ambulatory Visit: Payer: Self-pay

## 2017-11-26 ENCOUNTER — Ambulatory Visit: Payer: Self-pay

## 2017-12-26 ENCOUNTER — Ambulatory Visit: Payer: Self-pay

## 2017-12-31 IMAGING — US US PELVIS COMPLETE
1 series · 15 of 25 positions shown · non-contrast
Comparison: MRI of the pelvis of April 15, 2015

CLINICAL DATA: Known uterine fibroids; endometrial ablation in 9626



[Series 1: us pelvis complete · 15 of 61 slices shown]
[im 1/61]
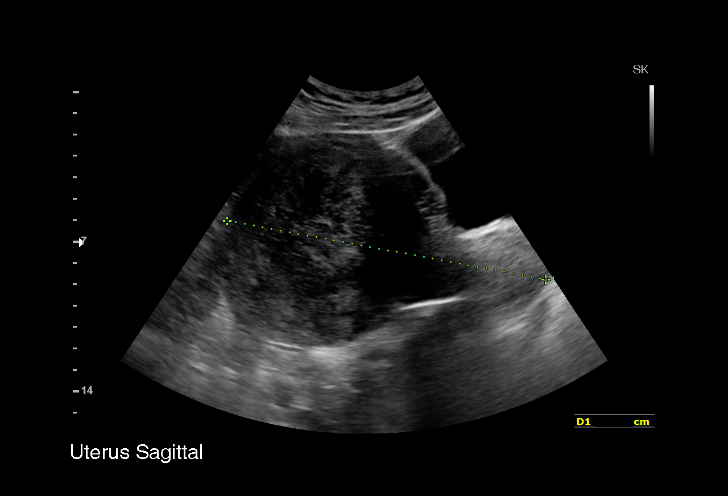
[im 6/61]
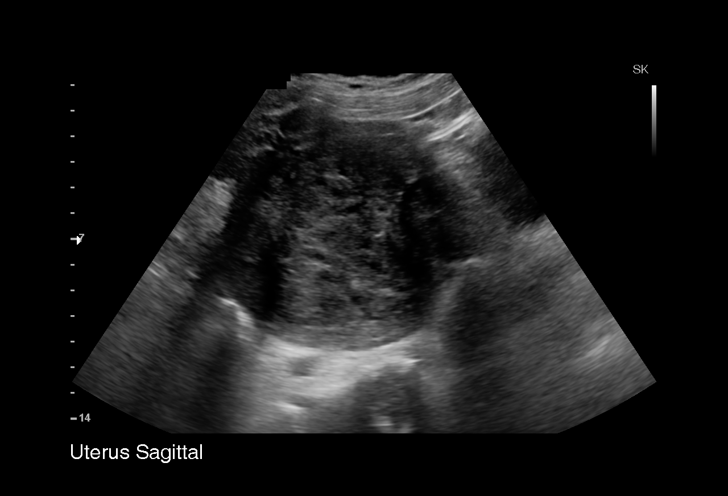
[im 11/61]
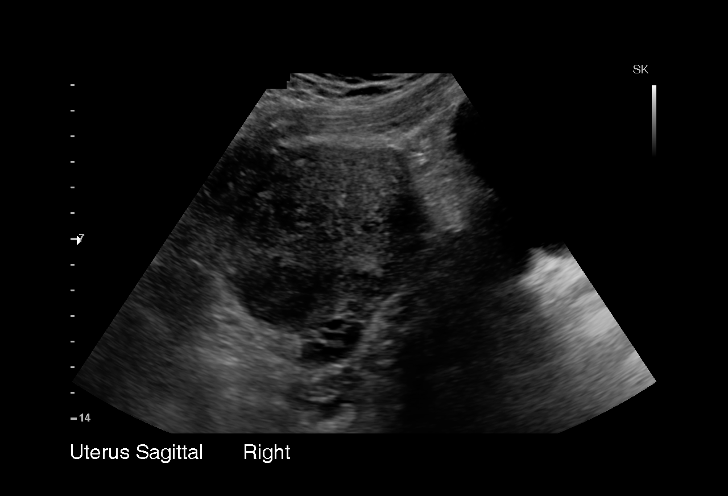
[im 13/61]
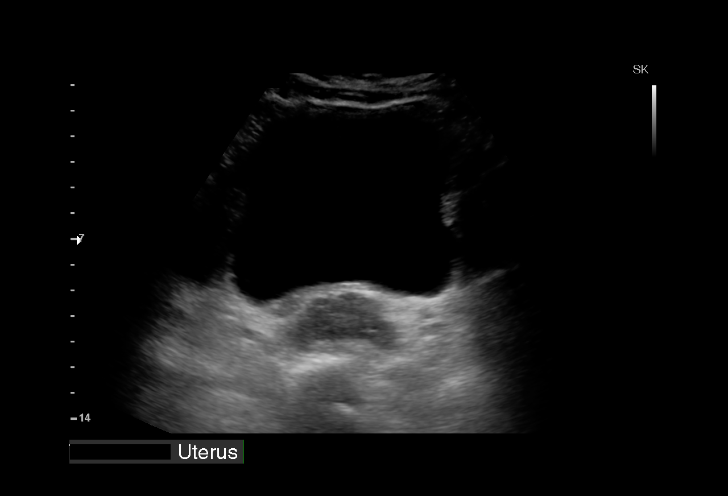
[im 18/61]
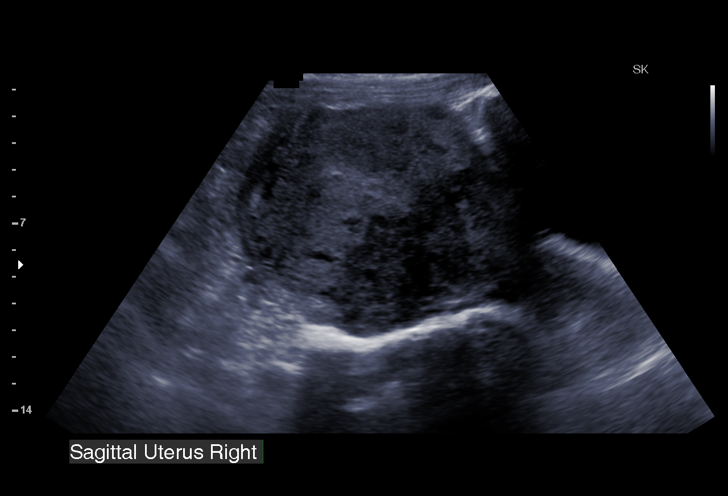
[im 23/61]
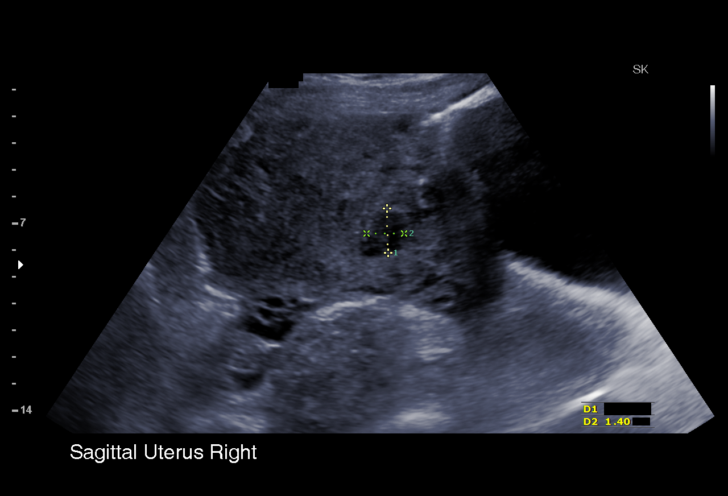
[im 26/61]
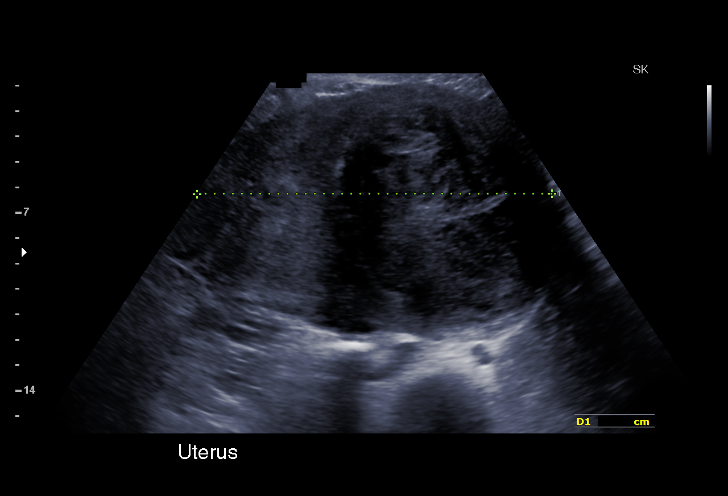
[im 31/61]
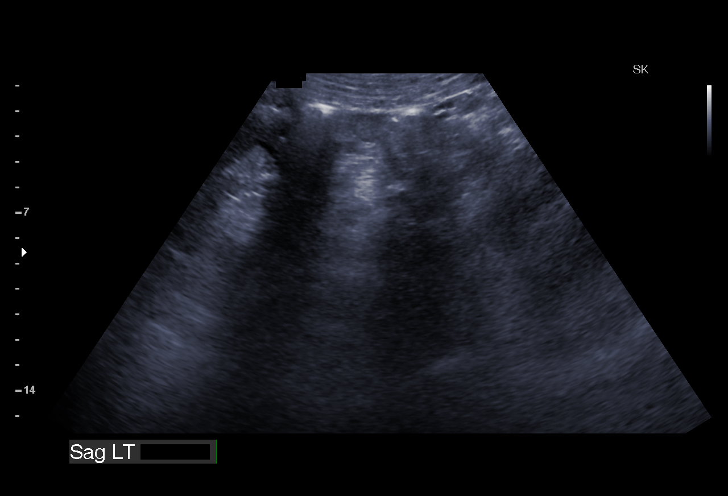
[im 36/61]
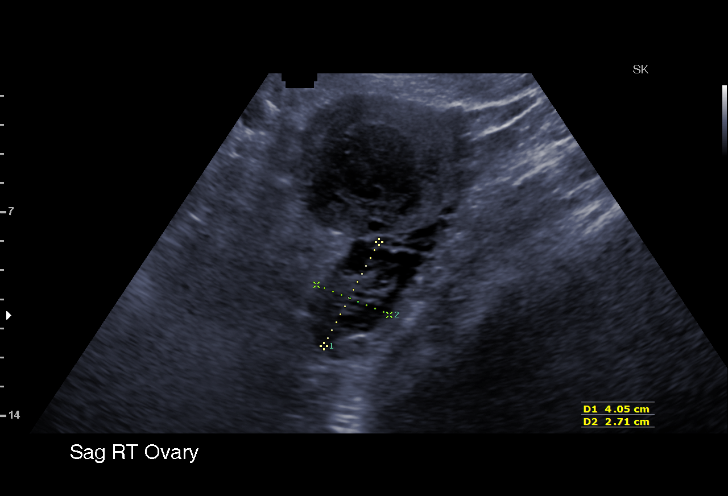
[im 38/61]
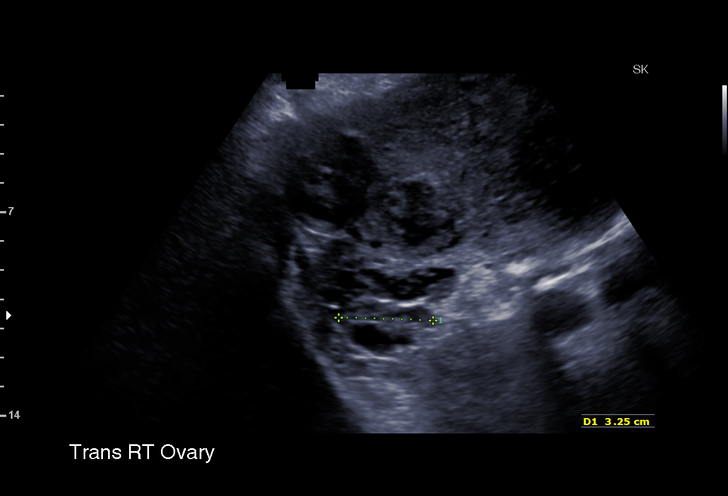
[im 43/61]
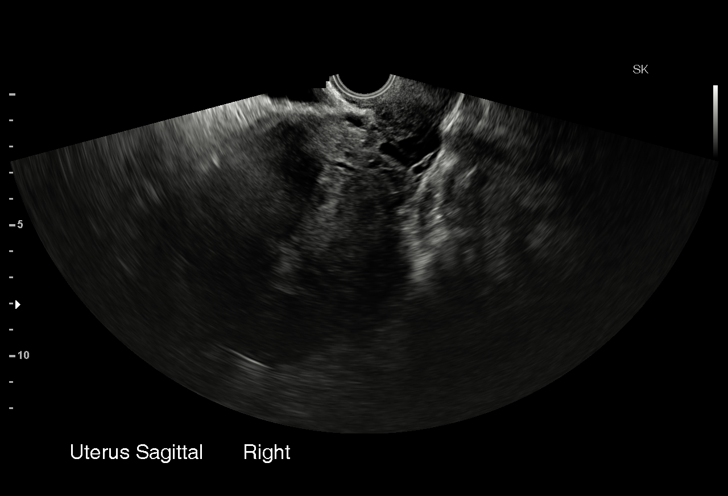
[im 48/61]
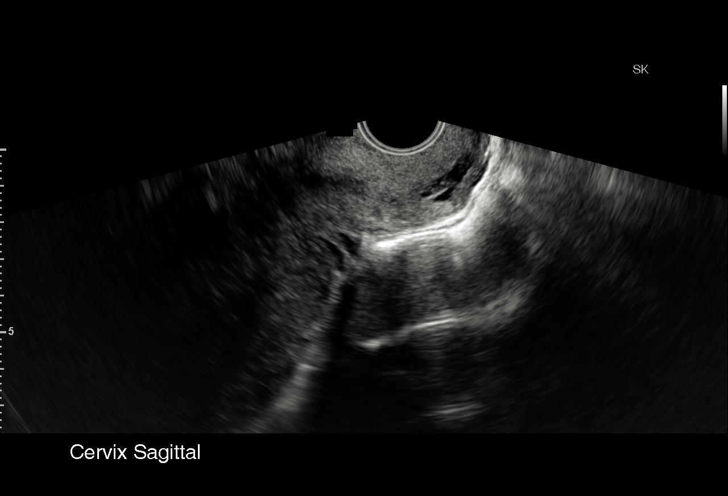
[im 51/61]
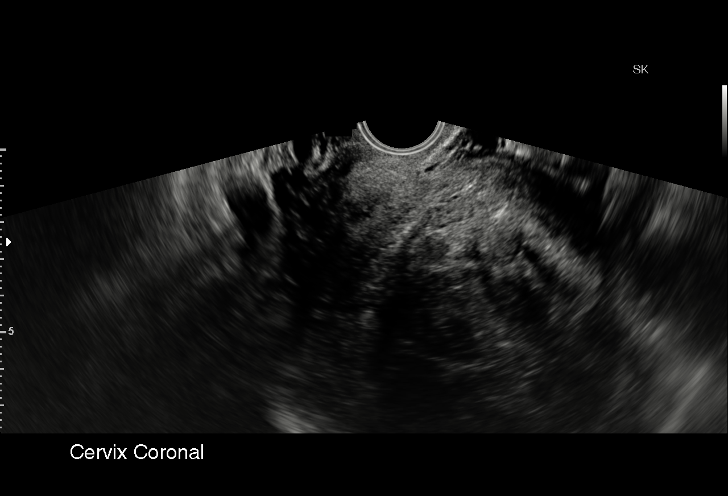
[im 56/61]
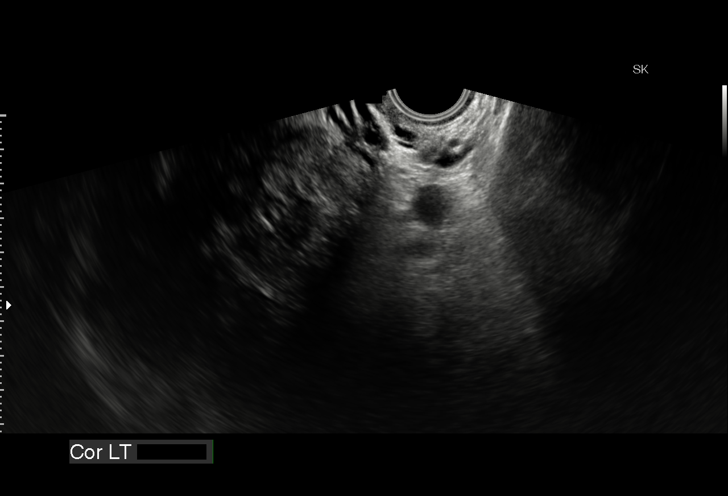
[im 61/61]
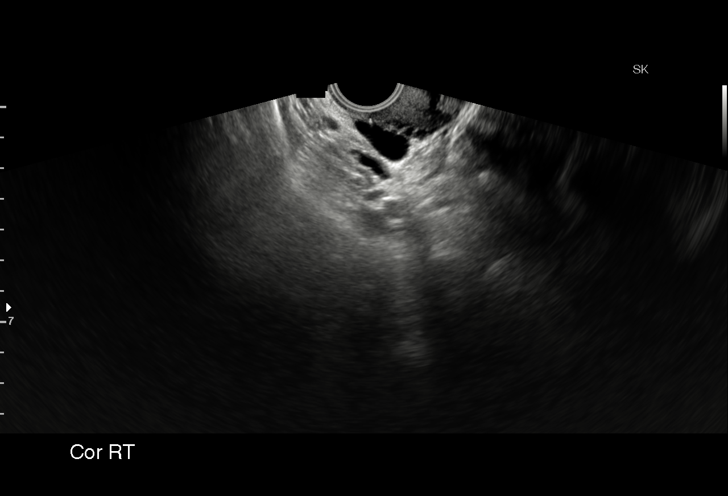

[15 of 25 positions shown; findings below may reference images not displayed]

FINDINGS: Uterus

Measurements: 16.9 x 10.4 x 14.8 cm. There are multiple old
hypoechoic fibroids. The dominant lesion lies on the left and
measures 10.3 x 9.9 x 8.7 cm. There are 2 smaller lesions observed
which are on the right which measure 3.6 x 3.1 x 2.9 cm and 2.8 x
2.4 x 2.3 cm. These are in the fundus and mid body. One is
submucosal impacting the endometrium. A posterior fibroid is
observed in the mid body that measures 1.7 x 1.4 x 1.7 cm.

Endometrium

Thickness: The endometrial stripe is difficult to discern but where
visualized it measures 8.9 mm in thickness. No endometrial fluid
collections or polyps are observed..

Right ovary

Measurements: 4.1 x 2.7 x 3.3 cm. Normal appearance/no adnexal mass.

Left ovary

The left ovary could not be demonstrated.

Other findings

No abnormal free fluid.
IMPRESSION: 1. Multiple uterine fibroids. The largest measures 10.3 cm in
greatest dimension and is on the left in the fundus. This has
increased in size since the previous study.
2. The endometrium is difficult to demonstrate but does not appear
markedly thickened measuring 8.9 mm.
3. Normal appearance of the right ovary. Nonvisualization of the
left ovary.

## 2018-01-09 ENCOUNTER — Encounter: Payer: Self-pay | Admitting: Radiology

## 2018-01-09 ENCOUNTER — Ambulatory Visit
Admission: RE | Admit: 2018-01-09 | Discharge: 2018-01-09 | Disposition: A | Discharge status: Home / Self Care | Payer: Medicaid Other | Source: Ambulatory Visit | Attending: Internal Medicine | Admitting: Internal Medicine

## 2018-01-09 DIAGNOSIS — Z1231 Encounter for screening mammogram for malignant neoplasm of breast: Secondary | ICD-10-CM

## 2018-11-27 ENCOUNTER — Other Ambulatory Visit: Payer: Self-pay

## 2018-11-27 ENCOUNTER — Ambulatory Visit (INDEPENDENT_AMBULATORY_CARE_PROVIDER_SITE_OTHER): Payer: Medicaid Other | Admitting: Obstetrics and Gynecology

## 2018-11-27 ENCOUNTER — Other Ambulatory Visit (HOSPITAL_COMMUNITY)
Admission: RE | Admit: 2018-11-27 | Discharge: 2018-11-27 | Disposition: A | Discharge status: Home / Self Care | Payer: Medicaid Other | Source: Ambulatory Visit | Attending: Obstetrics and Gynecology | Admitting: Obstetrics and Gynecology

## 2018-11-27 ENCOUNTER — Encounter: Payer: Self-pay | Admitting: Obstetrics and Gynecology

## 2018-11-27 VITALS — BP 127/81 | HR 82 | Ht 63.0 in | Wt 181.0 lb

## 2018-11-27 DIAGNOSIS — Z113 Encounter for screening for infections with a predominantly sexual mode of transmission: Secondary | ICD-10-CM | POA: Diagnosis present

## 2018-11-27 DIAGNOSIS — Z0000 Encounter for general adult medical examination without abnormal findings: Secondary | ICD-10-CM

## 2018-11-27 DIAGNOSIS — Z01419 Encounter for gynecological examination (general) (routine) without abnormal findings: Secondary | ICD-10-CM

## 2018-11-27 NOTE — Progress Notes (Signed)
GYNECOLOGY ANNUAL PREVENTATIVE CARE ENCOUNTER NOTE  Subjective:   Kendra Zimmerman is a 47 y.o. [phone] female here for a annual gynecologic exam. Current complaints: none other than occasional itching. Denies abnormal vaginal bleeding, discharge, pelvic pain, problems with intercourse or other gynecologic concerns.    Gynecologic History Patient's last menstrual period was 04/26/2017. Contraception: status post hysterectomy Last Pap: 2017. Results were: normal Last mammogram: 12/2017. Results were: Birads 1  Obstetric History OB History  Gravida Para Term Preterm AB Living  9 6 6   3 6   SAB TAB Ectopic Multiple Live Births  0 3          # Outcome Date GA Lbr Len/2nd Weight Sex Delivery Anes PTL Lv  9 TAB           8 TAB           7 TAB           6 Term           5 Term           4 Term           3 Term           2 Term           1 Term             Past Medical History:  Diagnosis Date  . Alcohol abuse    sober 20 months  . Anxiety   . Chlamydia   . Hypertension   . Hyperthyroidism   . Syphilis   . Trichomonas    Past Surgical History:  Procedure Laterality Date  . CYSTOSCOPY N/A 05/02/2017   Procedure: CYSTOSCOPY;  Surgeon: Chancy Milroy, MD;  Location: [facility];  Service: Gynecology;  Laterality: N/A;  . in grown toe nail    . INDUCED ABORTION    . MYOMECTOMY N/A 02/08/2016   Procedure: ABDOMINAL MYOMECTOMY;  Surgeon: Frederico Hamman, MD;  Location: [facility];  Service: Gynecology;  Laterality: N/A;  . NOVASURE ABLATION     2010  . OOPHORECTOMY Left 05/02/2017   Procedure: SALPINGO-OOPHORECTOMY;  Surgeon: Chancy Milroy, MD;  Location: [facility];  Service: Gynecology;  Laterality: Left;  . SALPINGOOPHORECTOMY Right 05/02/2017   Procedure: RIGHT SALPINGECTOMY;  Surgeon: Chancy Milroy, MD;  Location: [facility];  Service: Gynecology;  Laterality: Right;  . TUBAL LIGATION    . VAGINAL HYSTERECTOMY N/A 05/02/2017   Procedure: HYSTERECTOMY VAGINAL;  Surgeon: Chancy Milroy, MD;  Location: [facility];  Service: Gynecology;  Laterality: N/A;   Current Outpatient Medications on File Prior to Visit  Medication Sig Dispense Refill  . ARTIFICIAL TEAR OP Apply 2 drops to eye 3 (three) times daily as needed (dry eyes).    Marland Kitchen docusate sodium (COLACE) 100 MG capsule Take 1 capsule (100 mg total) by mouth 2 (two) times daily. (Patient not taking: Reported on 11/27/2018) 10 capsule 0  . levothyroxine (SYNTHROID, LEVOTHROID) 25 MCG tablet Take 25 mcg by mouth daily before breakfast.    . lisinopril-hydrochlorothiazide (PRINZIDE,ZESTORETIC) 20-12.5 MG tablet Take 1 tablet by mouth at bedtime.     No current facility-administered medications on file prior to visit.    No Known Allergies  Social History   Socioeconomic History  . Marital status: Divorced    Spouse name: Not on file  . Number of children: Not on file  . Years of education: Not on file  . Highest  education level: Not on file  Occupational History  . Not on file  Social Needs  . Financial resource strain: Not on file  . Food insecurity:    Worry: Not on file    Inability: Not on file  . Transportation needs:    Medical: Not on file    Non-medical: Not on file  Tobacco Use  . Smoking status: Never Smoker  . Smokeless tobacco: Never Used  Substance and Sexual Activity  . Alcohol use: Yes    Comment: daily  . Drug use: No  . Sexual activity: Yes    Birth control/protection: None    Comment: ablation 2017  Lifestyle  . Physical activity:    Days per week: Not on file    Minutes per session: Not on file  . Stress: Not on file  Relationships  . Social connections:    Talks on phone: Not on file    Gets together: Not on file    Attends religious service: Not on file    Active member of club or organization: Not on file    Attends meetings of clubs or organizations: Not on file    Relationship status: Not on file  . Intimate partner violence:    Fear of current or ex partner: Not on  file    Emotionally abused: Not on file    Physically abused: Not on file    Forced sexual activity: Not on file  Other Topics Concern  . Not on file  Social History Narrative  . Not on file    Family History  Problem Relation Age of Onset  . Alzheimer's disease Paternal Grandmother   . Hypertension Maternal Grandmother   . Alcohol abuse Maternal Grandmother   . Anesthesia problems Neg Hx   . Hypotension Neg Hx   . Malignant hyperthermia Neg Hx   . Pseudochol deficiency Neg Hx    The following portions of the patient's history were reviewed and updated as appropriate: allergies, current medications, past family history, past medical history, past social history, past surgical history and problem list.  Review of Systems Pertinent items are noted in HPI.   Objective:  BP 127/81   Pulse 82   Ht 5\' 3"  (1.6 m)   Wt 181 lb (82.1 kg)   LMP 04/26/2017   BMI 32.06 kg/m  CONSTITUTIONAL: Well-developed, well-nourished female in no acute distress.  HENT:  Normocephalic, atraumatic, External right and left ear normal. Oropharynx is clear and moist EYES: Conjunctivae and EOM are normal. Pupils are equal, round, and reactive to light. No scleral icterus.  NECK: Normal range of motion, supple, no masses.  Normal thyroid.  SKIN: Skin is warm and dry. No rash noted. Not diaphoretic. No erythema. No pallor. NEUROLOGIC: Alert and oriented to person, place, and time. Normal reflexes, muscle tone coordination. No cranial nerve deficit noted. PSYCHIATRIC: Normal mood and affect. Normal behavior. Normal judgment and thought content. CARDIOVASCULAR: Normal heart rate noted, regular rhythm RESPIRATORY: Clear to auscultation bilaterally. Effort and breath sounds normal, no problems with respiration noted. BREASTS: Symmetric in size. No masses, skin changes, nipple drainage, or lymphadenopathy. ABDOMEN: Soft, normal bowel sounds, no distention noted.  No tenderness, rebound or guarding.  PELVIC:  Normal appearing external genitalia; normal appearing vaginal mucosa, cervix surgically absent. No abnormal discharge noted. no other palpable masses, no adnexal tenderness. MUSCULOSKELETAL: Normal range of motion. No tenderness.  No cyanosis, clubbing, or edema.  2+ distal pulses.   Assessment and Plan:   1.  Well woman exam Benign exam No issues  2. Routine screening for STI (sexually transmitted infection) - Cervicovaginal ancillary only( [facility]) - HIV antibody (with reflex) - RPR - Hepatitis B surface antigen - Hepatitis C Antibody   Will follow up results of STI screen and manage accordingly. Encouraged improvement in diet and exercise.  Mammogram scheduled for January  Routine preventative health maintenance measures emphasized. Please refer to After Visit Summary for other counseling recommendations.    Feliz Beam, M.D. Attending [facility] for [employer] [facility])

## 2018-11-27 NOTE — Progress Notes (Signed)
Presents for AEX,  Wants STD testing. Last Mammo 12/2017.

## 2018-11-28 LAB — HEPATITIS B SURFACE ANTIGEN: HEP B S AG: NEGATIVE

## 2018-11-28 LAB — HEPATITIS C ANTIBODY

## 2018-11-28 LAB — HIV ANTIBODY (ROUTINE TESTING W REFLEX): HIV Screen 4th Generation wRfx: NONREACTIVE

## 2018-11-28 LAB — RPR: RPR Ser Ql: NONREACTIVE

## 2018-11-28 LAB — CERVICOVAGINAL ANCILLARY ONLY
Chlamydia: NEGATIVE
Neisseria Gonorrhea: NEGATIVE
TRICH (WINDOWPATH): NEGATIVE

## 2019-02-12 ENCOUNTER — Other Ambulatory Visit: Payer: Self-pay | Admitting: Internal Medicine

## 2019-02-12 DIAGNOSIS — Z1231 Encounter for screening mammogram for malignant neoplasm of breast: Secondary | ICD-10-CM

## 2019-03-12 ENCOUNTER — Ambulatory Visit: Payer: Medicaid Other

## 2019-03-26 ENCOUNTER — Ambulatory Visit: Payer: Medicaid Other
